# Patient Record
Sex: Male | Born: 1950 | Race: White | Hispanic: No | Smoking: Never smoker
Health system: Southern US, Community
[De-identification: ages and names within clinical notes are randomized; demographics above are authoritative.]

## PROBLEM LIST (undated history)

## (undated) DIAGNOSIS — C259 Malignant neoplasm of pancreas, unspecified: Secondary | ICD-10-CM

## (undated) HISTORY — PX: APPENDECTOMY: SHX54

---

## 2016-02-10 DIAGNOSIS — E785 Hyperlipidemia, unspecified: Secondary | ICD-10-CM | POA: Diagnosis not present

## 2016-02-10 DIAGNOSIS — Z9181 History of falling: Secondary | ICD-10-CM | POA: Diagnosis not present

## 2016-02-10 DIAGNOSIS — Z79899 Other long term (current) drug therapy: Secondary | ICD-10-CM | POA: Diagnosis not present

## 2016-02-10 DIAGNOSIS — M25511 Pain in right shoulder: Secondary | ICD-10-CM | POA: Diagnosis not present

## 2016-02-10 DIAGNOSIS — Z6828 Body mass index (BMI) 28.0-28.9, adult: Secondary | ICD-10-CM | POA: Diagnosis not present

## 2016-02-10 DIAGNOSIS — Z1389 Encounter for screening for other disorder: Secondary | ICD-10-CM | POA: Diagnosis not present

## 2016-02-10 DIAGNOSIS — R739 Hyperglycemia, unspecified: Secondary | ICD-10-CM | POA: Diagnosis not present

## 2016-02-10 DIAGNOSIS — I10 Essential (primary) hypertension: Secondary | ICD-10-CM | POA: Diagnosis not present

## 2016-08-11 DIAGNOSIS — K219 Gastro-esophageal reflux disease without esophagitis: Secondary | ICD-10-CM | POA: Diagnosis not present

## 2016-08-11 DIAGNOSIS — Z79899 Other long term (current) drug therapy: Secondary | ICD-10-CM | POA: Diagnosis not present

## 2016-08-11 DIAGNOSIS — M25511 Pain in right shoulder: Secondary | ICD-10-CM | POA: Diagnosis not present

## 2016-08-11 DIAGNOSIS — R0602 Shortness of breath: Secondary | ICD-10-CM | POA: Diagnosis not present

## 2016-08-11 DIAGNOSIS — I10 Essential (primary) hypertension: Secondary | ICD-10-CM | POA: Diagnosis not present

## 2016-08-11 DIAGNOSIS — Z6827 Body mass index (BMI) 27.0-27.9, adult: Secondary | ICD-10-CM | POA: Diagnosis not present

## 2016-08-11 DIAGNOSIS — R739 Hyperglycemia, unspecified: Secondary | ICD-10-CM | POA: Diagnosis not present

## 2016-08-11 DIAGNOSIS — E785 Hyperlipidemia, unspecified: Secondary | ICD-10-CM | POA: Diagnosis not present

## 2016-08-11 DIAGNOSIS — Z125 Encounter for screening for malignant neoplasm of prostate: Secondary | ICD-10-CM | POA: Diagnosis not present

## 2016-08-15 DIAGNOSIS — M25511 Pain in right shoulder: Secondary | ICD-10-CM | POA: Diagnosis not present

## 2016-08-15 DIAGNOSIS — M7541 Impingement syndrome of right shoulder: Secondary | ICD-10-CM | POA: Diagnosis not present

## 2016-10-27 DIAGNOSIS — M159 Polyosteoarthritis, unspecified: Secondary | ICD-10-CM | POA: Diagnosis not present

## 2016-10-27 DIAGNOSIS — R0781 Pleurodynia: Secondary | ICD-10-CM | POA: Diagnosis not present

## 2016-10-27 DIAGNOSIS — Z6827 Body mass index (BMI) 27.0-27.9, adult: Secondary | ICD-10-CM | POA: Diagnosis not present

## 2016-10-27 DIAGNOSIS — E119 Type 2 diabetes mellitus without complications: Secondary | ICD-10-CM | POA: Diagnosis not present

## 2016-10-27 DIAGNOSIS — I10 Essential (primary) hypertension: Secondary | ICD-10-CM | POA: Diagnosis not present

## 2016-10-27 DIAGNOSIS — A6000 Herpesviral infection of urogenital system, unspecified: Secondary | ICD-10-CM | POA: Diagnosis not present

## 2016-10-27 DIAGNOSIS — R809 Proteinuria, unspecified: Secondary | ICD-10-CM | POA: Diagnosis not present

## 2016-10-27 DIAGNOSIS — J309 Allergic rhinitis, unspecified: Secondary | ICD-10-CM | POA: Diagnosis not present

## 2016-10-27 DIAGNOSIS — K7689 Other specified diseases of liver: Secondary | ICD-10-CM | POA: Diagnosis not present

## 2016-10-27 DIAGNOSIS — K219 Gastro-esophageal reflux disease without esophagitis: Secondary | ICD-10-CM | POA: Diagnosis not present

## 2017-01-31 DIAGNOSIS — M25511 Pain in right shoulder: Secondary | ICD-10-CM | POA: Diagnosis not present

## 2017-02-01 DIAGNOSIS — M75121 Complete rotator cuff tear or rupture of right shoulder, not specified as traumatic: Secondary | ICD-10-CM | POA: Diagnosis not present

## 2017-02-06 DIAGNOSIS — M75121 Complete rotator cuff tear or rupture of right shoulder, not specified as traumatic: Secondary | ICD-10-CM | POA: Diagnosis not present

## 2017-02-10 DIAGNOSIS — Z6828 Body mass index (BMI) 28.0-28.9, adult: Secondary | ICD-10-CM | POA: Diagnosis not present

## 2017-02-10 DIAGNOSIS — Z79899 Other long term (current) drug therapy: Secondary | ICD-10-CM | POA: Diagnosis not present

## 2017-02-10 DIAGNOSIS — E785 Hyperlipidemia, unspecified: Secondary | ICD-10-CM | POA: Diagnosis not present

## 2017-02-10 DIAGNOSIS — R739 Hyperglycemia, unspecified: Secondary | ICD-10-CM | POA: Diagnosis not present

## 2017-02-10 DIAGNOSIS — M1990 Unspecified osteoarthritis, unspecified site: Secondary | ICD-10-CM | POA: Diagnosis not present

## 2017-02-10 DIAGNOSIS — I10 Essential (primary) hypertension: Secondary | ICD-10-CM | POA: Diagnosis not present

## 2017-02-10 DIAGNOSIS — K219 Gastro-esophageal reflux disease without esophagitis: Secondary | ICD-10-CM | POA: Diagnosis not present

## 2017-03-21 DIAGNOSIS — Z0000 Encounter for general adult medical examination without abnormal findings: Secondary | ICD-10-CM | POA: Diagnosis not present

## 2017-03-21 DIAGNOSIS — E785 Hyperlipidemia, unspecified: Secondary | ICD-10-CM | POA: Diagnosis not present

## 2017-03-21 DIAGNOSIS — Z9181 History of falling: Secondary | ICD-10-CM | POA: Diagnosis not present

## 2017-03-21 DIAGNOSIS — Z1331 Encounter for screening for depression: Secondary | ICD-10-CM | POA: Diagnosis not present

## 2017-03-21 DIAGNOSIS — Z125 Encounter for screening for malignant neoplasm of prostate: Secondary | ICD-10-CM | POA: Diagnosis not present

## 2017-03-23 DIAGNOSIS — R002 Palpitations: Secondary | ICD-10-CM | POA: Diagnosis not present

## 2017-03-23 DIAGNOSIS — Z1331 Encounter for screening for depression: Secondary | ICD-10-CM | POA: Diagnosis not present

## 2017-03-23 DIAGNOSIS — Z6827 Body mass index (BMI) 27.0-27.9, adult: Secondary | ICD-10-CM | POA: Diagnosis not present

## 2017-03-23 DIAGNOSIS — I493 Ventricular premature depolarization: Secondary | ICD-10-CM | POA: Diagnosis not present

## 2017-04-12 DIAGNOSIS — K529 Noninfective gastroenteritis and colitis, unspecified: Secondary | ICD-10-CM | POA: Diagnosis not present

## 2017-04-12 DIAGNOSIS — K219 Gastro-esophageal reflux disease without esophagitis: Secondary | ICD-10-CM | POA: Diagnosis not present

## 2017-04-12 DIAGNOSIS — Z6827 Body mass index (BMI) 27.0-27.9, adult: Secondary | ICD-10-CM | POA: Diagnosis not present

## 2017-05-01 DIAGNOSIS — R1013 Epigastric pain: Secondary | ICD-10-CM | POA: Diagnosis not present

## 2017-05-01 DIAGNOSIS — R0789 Other chest pain: Secondary | ICD-10-CM | POA: Diagnosis not present

## 2017-05-01 DIAGNOSIS — R634 Abnormal weight loss: Secondary | ICD-10-CM | POA: Diagnosis not present

## 2017-05-09 DIAGNOSIS — I10 Essential (primary) hypertension: Secondary | ICD-10-CM | POA: Diagnosis not present

## 2017-05-09 DIAGNOSIS — I493 Ventricular premature depolarization: Secondary | ICD-10-CM | POA: Diagnosis not present

## 2017-05-19 DIAGNOSIS — E785 Hyperlipidemia, unspecified: Secondary | ICD-10-CM | POA: Diagnosis not present

## 2017-05-19 DIAGNOSIS — I10 Essential (primary) hypertension: Secondary | ICD-10-CM | POA: Diagnosis not present

## 2017-05-19 DIAGNOSIS — R739 Hyperglycemia, unspecified: Secondary | ICD-10-CM | POA: Diagnosis not present

## 2017-05-19 DIAGNOSIS — Z6826 Body mass index (BMI) 26.0-26.9, adult: Secondary | ICD-10-CM | POA: Diagnosis not present

## 2017-05-19 DIAGNOSIS — K219 Gastro-esophageal reflux disease without esophagitis: Secondary | ICD-10-CM | POA: Diagnosis not present

## 2017-05-19 DIAGNOSIS — E663 Overweight: Secondary | ICD-10-CM | POA: Diagnosis not present

## 2017-05-19 DIAGNOSIS — Z79899 Other long term (current) drug therapy: Secondary | ICD-10-CM | POA: Diagnosis not present

## 2017-05-25 DIAGNOSIS — I493 Ventricular premature depolarization: Secondary | ICD-10-CM | POA: Diagnosis not present

## 2017-05-25 DIAGNOSIS — I499 Cardiac arrhythmia, unspecified: Secondary | ICD-10-CM | POA: Diagnosis not present

## 2017-06-01 DIAGNOSIS — Z6826 Body mass index (BMI) 26.0-26.9, adult: Secondary | ICD-10-CM | POA: Diagnosis not present

## 2017-06-01 DIAGNOSIS — R109 Unspecified abdominal pain: Secondary | ICD-10-CM | POA: Diagnosis not present

## 2017-06-01 DIAGNOSIS — K219 Gastro-esophageal reflux disease without esophagitis: Secondary | ICD-10-CM | POA: Diagnosis not present

## 2017-06-01 DIAGNOSIS — K5909 Other constipation: Secondary | ICD-10-CM | POA: Diagnosis not present

## 2017-06-06 DIAGNOSIS — K2210 Ulcer of esophagus without bleeding: Secondary | ICD-10-CM | POA: Diagnosis not present

## 2017-06-06 DIAGNOSIS — K2270 Barrett's esophagus without dysplasia: Secondary | ICD-10-CM | POA: Diagnosis not present

## 2017-06-06 DIAGNOSIS — K219 Gastro-esophageal reflux disease without esophagitis: Secondary | ICD-10-CM | POA: Diagnosis not present

## 2017-06-06 DIAGNOSIS — Z1211 Encounter for screening for malignant neoplasm of colon: Secondary | ICD-10-CM | POA: Diagnosis not present

## 2017-06-06 DIAGNOSIS — Z79899 Other long term (current) drug therapy: Secondary | ICD-10-CM | POA: Diagnosis not present

## 2017-06-06 DIAGNOSIS — R634 Abnormal weight loss: Secondary | ICD-10-CM | POA: Diagnosis not present

## 2017-06-06 DIAGNOSIS — K208 Other esophagitis: Secondary | ICD-10-CM | POA: Diagnosis not present

## 2017-06-06 DIAGNOSIS — K449 Diaphragmatic hernia without obstruction or gangrene: Secondary | ICD-10-CM | POA: Diagnosis not present

## 2017-06-06 DIAGNOSIS — R1010 Upper abdominal pain, unspecified: Secondary | ICD-10-CM | POA: Diagnosis not present

## 2017-06-06 DIAGNOSIS — K210 Gastro-esophageal reflux disease with esophagitis: Secondary | ICD-10-CM | POA: Diagnosis not present

## 2017-06-06 DIAGNOSIS — Z7982 Long term (current) use of aspirin: Secondary | ICD-10-CM | POA: Diagnosis not present

## 2017-06-30 DIAGNOSIS — R1011 Right upper quadrant pain: Secondary | ICD-10-CM | POA: Diagnosis not present

## 2017-07-06 DIAGNOSIS — K5900 Constipation, unspecified: Secondary | ICD-10-CM | POA: Diagnosis not present

## 2017-07-06 DIAGNOSIS — K219 Gastro-esophageal reflux disease without esophagitis: Secondary | ICD-10-CM | POA: Diagnosis not present

## 2017-07-06 DIAGNOSIS — K2270 Barrett's esophagus without dysplasia: Secondary | ICD-10-CM | POA: Diagnosis not present

## 2017-07-20 DIAGNOSIS — R1011 Right upper quadrant pain: Secondary | ICD-10-CM | POA: Diagnosis not present

## 2017-07-20 DIAGNOSIS — R109 Unspecified abdominal pain: Secondary | ICD-10-CM | POA: Diagnosis not present

## 2017-07-25 DIAGNOSIS — R1013 Epigastric pain: Secondary | ICD-10-CM | POA: Diagnosis not present

## 2017-07-25 DIAGNOSIS — R634 Abnormal weight loss: Secondary | ICD-10-CM | POA: Diagnosis not present

## 2017-07-25 DIAGNOSIS — R1033 Periumbilical pain: Secondary | ICD-10-CM | POA: Diagnosis not present

## 2017-07-26 DIAGNOSIS — R1033 Periumbilical pain: Secondary | ICD-10-CM | POA: Diagnosis not present

## 2017-07-26 DIAGNOSIS — R634 Abnormal weight loss: Secondary | ICD-10-CM | POA: Diagnosis not present

## 2017-07-26 DIAGNOSIS — R1013 Epigastric pain: Secondary | ICD-10-CM | POA: Diagnosis not present

## 2017-07-26 DIAGNOSIS — I82890 Acute embolism and thrombosis of other specified veins: Secondary | ICD-10-CM | POA: Diagnosis not present

## 2017-07-26 DIAGNOSIS — C259 Malignant neoplasm of pancreas, unspecified: Secondary | ICD-10-CM | POA: Diagnosis not present

## 2017-08-01 DIAGNOSIS — K8689 Other specified diseases of pancreas: Secondary | ICD-10-CM | POA: Diagnosis not present

## 2017-08-02 ENCOUNTER — Other Ambulatory Visit: Payer: Self-pay

## 2017-08-02 ENCOUNTER — Telehealth: Payer: Self-pay | Admitting: Gastroenterology

## 2017-08-02 DIAGNOSIS — K8689 Other specified diseases of pancreas: Secondary | ICD-10-CM

## 2017-08-02 DIAGNOSIS — R935 Abnormal findings on diagnostic imaging of other abdominal regions, including retroperitoneum: Secondary | ICD-10-CM | POA: Diagnosis not present

## 2017-08-02 DIAGNOSIS — C259 Malignant neoplasm of pancreas, unspecified: Secondary | ICD-10-CM | POA: Diagnosis not present

## 2017-08-02 DIAGNOSIS — I700 Atherosclerosis of aorta: Secondary | ICD-10-CM | POA: Diagnosis not present

## 2017-08-02 NOTE — Telephone Encounter (Signed)
[facility] 230 pm arrive at 1230 pm for EUS the pt has been advised and instructed.

## 2017-08-02 NOTE — Telephone Encounter (Signed)
I reviewed a packet of information from the [facility].  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  No obvious liver metastases.  His liver tests are normal.     Patty, He needs endoscopic ultrasound with fine-needle aspiration.  I would like to do this for him tomorrow if [facility] endoscopy can support it.  If not then next Thursday if [place] endoscopy can support it.  MAC sedation, diagnosis pancreatic mass.  He is not on any blood thinners.

## 2017-08-02 NOTE — Telephone Encounter (Signed)
Please review

## 2017-08-03 ENCOUNTER — Ambulatory Visit (HOSPITAL_COMMUNITY): Payer: Medicare Other | Admitting: Anesthesiology

## 2017-08-03 ENCOUNTER — Encounter (HOSPITAL_COMMUNITY): Payer: Self-pay | Admitting: Registered Nurse

## 2017-08-03 ENCOUNTER — Telehealth: Payer: Self-pay | Admitting: Gastroenterology

## 2017-08-03 ENCOUNTER — Encounter (HOSPITAL_COMMUNITY)
Admission: RE | Disposition: A | Discharge status: Home / Self Care | Payer: Self-pay | Source: Ambulatory Visit | Attending: Gastroenterology

## 2017-08-03 ENCOUNTER — Ambulatory Visit (HOSPITAL_COMMUNITY)
Admission: RE | Admit: 2017-08-03 | Discharge: 2017-08-03 | Disposition: A | Discharge status: Home / Self Care | Payer: Medicare Other | Source: Ambulatory Visit | Attending: Gastroenterology | Admitting: Gastroenterology

## 2017-08-03 ENCOUNTER — Other Ambulatory Visit: Payer: Self-pay

## 2017-08-03 DIAGNOSIS — Z79899 Other long term (current) drug therapy: Secondary | ICD-10-CM | POA: Insufficient documentation

## 2017-08-03 DIAGNOSIS — R933 Abnormal findings on diagnostic imaging of other parts of digestive tract: Secondary | ICD-10-CM | POA: Diagnosis not present

## 2017-08-03 DIAGNOSIS — I10 Essential (primary) hypertension: Secondary | ICD-10-CM | POA: Diagnosis not present

## 2017-08-03 DIAGNOSIS — K869 Disease of pancreas, unspecified: Secondary | ICD-10-CM | POA: Diagnosis present

## 2017-08-03 DIAGNOSIS — K219 Gastro-esophageal reflux disease without esophagitis: Secondary | ICD-10-CM | POA: Insufficient documentation

## 2017-08-03 DIAGNOSIS — K8689 Other specified diseases of pancreas: Secondary | ICD-10-CM | POA: Diagnosis not present

## 2017-08-03 DIAGNOSIS — C251 Malignant neoplasm of body of pancreas: Secondary | ICD-10-CM

## 2017-08-03 HISTORY — PX: EUS: SHX5427

## 2017-08-03 HISTORY — PX: FINE NEEDLE ASPIRATION: SHX5430

## 2017-08-03 SURGERY — UPPER ENDOSCOPIC ULTRASOUND (EUS) RADIAL
Anesthesia: Monitor Anesthesia Care

## 2017-08-03 MED ORDER — PROPOFOL 10 MG/ML IV BOLUS
INTRAVENOUS | Status: AC
  Filled 2017-08-03: qty 20

## 2017-08-03 MED ORDER — SODIUM CHLORIDE 0.9 % IV SOLN: INTRAVENOUS | Status: DC

## 2017-08-03 MED ORDER — LACTATED RINGERS IV SOLN
INTRAVENOUS | Status: DC
  Administered 2017-08-03: 13:00:00 via INTRAVENOUS

## 2017-08-03 MED ORDER — PROPOFOL 10 MG/ML IV BOLUS
INTRAVENOUS | Status: DC | PRN
  Administered 2017-08-03 (×5): 20 mg via INTRAVENOUS
  Administered 2017-08-03: 30 mg via INTRAVENOUS
  Administered 2017-08-03 (×13): 20 mg via INTRAVENOUS

## 2017-08-03 MED ORDER — ONDANSETRON HCL 4 MG/2ML IJ SOLN
INTRAMUSCULAR | Status: DC | PRN
  Administered 2017-08-03: 4 mg via INTRAVENOUS

## 2017-08-03 MED ORDER — LIDOCAINE 2% (20 MG/ML) 5 ML SYRINGE
INTRAMUSCULAR | Status: DC | PRN
  Administered 2017-08-03: 40 mg via INTRAVENOUS

## 2017-08-03 NOTE — Op Note (Addendum)
[facility] Patient Name: Tommy Oconnor Procedure Date: 08/03/2017 MRN: [id number] Attending MD: Milus Banister , MD Date of Birth: 1950/11/10 CSN: [id number] Age: 67 Admit Type: Outpatient Procedure:                Upper EUS Indications:              abdominal pain and weight loss led to several tests                            including a CT scan in [place]; report states a                            5.1cm by 3.7cm mass in the body of pancreas with                            upstream pancreatic duct dilation. The mass                            involves the SMA, celiac trunk, SMV, splenic vein.                            No clear metastatic disease Providers:                Milus Banister, MD, Cleda Daub, RN, Charolette Child, Technician, Marla Roe, CRNA Referring MD:             Adonis Huguenin, RN at [facility] in                            [place]:                Propofol per Anesthesia Complications:            No immediate complications. Estimated blood loss:                            None. Estimated Blood Loss:     Estimated blood loss: none. Procedure:                Pre-Anesthesia Assessment:                           - Prior to the procedure, a History and Physical                            was performed, and patient medications and                            allergies were reviewed. The patient's tolerance of                            previous anesthesia was also reviewed. The risks  and benefits of the procedure and the sedation                            options and risks were discussed with the patient.                            All questions were answered, and informed consent                            was obtained. Prior Anticoagulants: The patient has                            taken no previous anticoagulant or antiplatelet                            agents. ASA  Grade Assessment: II - A patient with                            mild systemic disease. After reviewing the risks                            and benefits, the patient was deemed in                            satisfactory condition to undergo the procedure.                           After obtaining informed consent, the endoscope was                            passed under direct vision. Throughout the                            procedure, the patient's blood pressure, pulse, and                            oxygen saturations were monitored continuously. The                            [id number] ([id number]) scope was introduced through                            the mouth, and advanced to the second part of                            duodenum. The upper EUS was accomplished without                            difficulty. The patient tolerated the procedure                            well. Scope In: Scope Out: Findings:      ENDOSCOPIC FINDING: :      The examined esophagus  was endoscopically normal.      The entire examined stomach was endoscopically normal.      The examined duodenum was endoscopically normal.      ENDOSONOGRAPHIC FINDING: :      1. An irregular mass was identified in the pancreatic body. The mass was       hypoechoic and heterogenous. The mass measured 47 mm in maximal       cross-sectional diameter. The endosonographic borders were       poorly-defined. The endosonographic appearance of parenchyma and the       upstream pancreatic duct indicated duct dilation. Fine needle aspiration       for cytology was performed. Color Doppler imaging was utilized prior to       needle puncture to confirm a lack of significant vascular structures       within the needle path. Three passes were made with the 25 gauge needle       using a transgastric approach.      2. No peripancreatic adenopathy. Impression:               - Large mass in the body of pancreas (5.1cm per                             outside CT report) causing main pancreatic duct                            obstruction, upstream dilation. 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 The outside CT report shows the                            mass is unresectable based on significant major                            vessel involvement (SMV, SMA, celiac trunk).                           - Await final cytology results. Moderate Sedation:      N/A- Per Anesthesia Care Recommendation:           - Discharge patient to home (ambulatory). Procedure Code(s):        --- Professional ---                           [phone], Esophagogastroduodenoscopy, flexible,                            transoral; with transendoscopic ultrasound-guided                            intramural or  transmural fine needle                            aspiration/biopsy(s), (includes endoscopic                            ultrasound examination limited to the esophagus,                            stomach or duodenum, and adjacent structures) Diagnosis Code(s):        --- Professional ---                           R93.3, Abnormal findings on diagnostic imaging of                            other parts of digestive tract CPT copyright 2017 American Medical Association. All rights reserved. The codes documented in this report are preliminary and upon coder review may  be revised to meet current compliance requirements. Milus Banister, MD 08/03/2017 2:42:16 PM This report has been signed electronically. Number of Addenda: 0

## 2017-08-03 NOTE — Telephone Encounter (Signed)
Patient notified that [facility] to take his meds this am

## 2017-08-03 NOTE — Discharge Instructions (Signed)
YOU HAD AN ENDOSCOPIC PROCEDURE TODAY: Refer to the procedure report and other information in the discharge instructions given to you for any specific questions about what was found during the examination. If this information does not answer your questions, please call [place] office at [phone] to clarify.  ° °YOU SHOULD EXPECT: Some feelings of bloating in the abdomen. Passage of more gas than usual. Walking can help get rid of the air that was put into your GI tract during the procedure and reduce the bloating. If you had a lower endoscopy (such as a colonoscopy or flexible sigmoidoscopy) you may notice spotting of blood in your stool or on the toilet paper. Some abdominal soreness may be present for a day or two, also. ° °DIET: Your first meal following the procedure should be a light meal and then it is ok to progress to your normal diet. A half-sandwich or bowl of soup is an example of a good first meal. Heavy or fried foods are harder to digest and may make you feel nauseous or bloated. Drink plenty of fluids but you should avoid alcoholic beverages for 24 hours. If you had a esophageal dilation, please see attached instructions for diet.   ° °ACTIVITY: Your care partner should take you home directly after the procedure. You should plan to take it easy, moving slowly for the rest of the day. You can resume normal activity the day after the procedure however YOU SHOULD NOT DRIVE, use power tools, machinery or perform tasks that involve climbing or major physical exertion for 24 hours (because of the sedation medicines used during the test).  ° °SYMPTOMS TO REPORT IMMEDIATELY: °A gastroenterologist can be reached at any hour. Please call [phone]  for any of the following symptoms:  °Following lower endoscopy (colonoscopy, flexible sigmoidoscopy) °Excessive amounts of blood in the stool  °Significant tenderness, worsening of abdominal pains  °Swelling of the abdomen that is new, acute  °Fever of 100° or  higher  °Following upper endoscopy (EGD, EUS, ERCP, esophageal dilation) °Vomiting of blood or coffee ground material  °New, significant abdominal pain  °New, significant chest pain or pain under the shoulder blades  °Painful or persistently difficult swallowing  °New shortness of breath  °Black, tarry-looking or red, bloody stools ° °FOLLOW UP:  °If any biopsies were taken you will be contacted by phone or by letter within the next 1-3 weeks. Call [phone]  if you have not heard about the biopsies in 3 weeks.  °Please also call with any specific questions about appointments or follow up tests. ° °

## 2017-08-03 NOTE — Anesthesia Preprocedure Evaluation (Signed)
Anesthesia Evaluation  Patient identified by MRN, date of birth, ID band Patient awake    Reviewed: Allergy & Precautions, NPO status , Patient's Chart, lab work & pertinent test results  Airway Mallampati: II  TM Distance: >3 FB Neck ROM: Full    Dental   Pulmonary neg pulmonary ROS,    breath sounds clear to auscultation       Cardiovascular hypertension,  Rhythm:Regular Rate:Normal     Neuro/Psych negative neurological ROS     GI/Hepatic Neg liver ROS, GERD  ,Pancreatic mass   Endo/Other  negative endocrine ROS  Renal/GU negative Renal ROS     Musculoskeletal   Abdominal   Peds  Hematology negative hematology ROS (+)   Anesthesia Other Findings   Reproductive/Obstetrics                             Anesthesia Physical Anesthesia Plan  ASA: III  Anesthesia Plan: MAC   Post-op Pain Management:    Induction: Intravenous  PONV Risk Score and Plan: 1 and Propofol infusion, Ondansetron and Treatment may vary due to age or medical condition  Airway Management Planned: Natural Airway and Nasal Cannula  Additional Equipment:   Intra-op Plan:   Post-operative Plan:   Informed Consent: I have reviewed the patients History and Physical, chart, labs and discussed the procedure including the risks, benefits and alternatives for the proposed anesthesia with the patient or authorized representative who has indicated his/her understanding and acceptance.     Plan Discussed with:   Anesthesia Plan Comments:         Anesthesia Quick Evaluation

## 2017-08-03 NOTE — Transfer of Care (Signed)
Immediate Anesthesia Transfer of Care Note  Patient: Tommy Oconnor  Procedure(s) Performed: UPPER ENDOSCOPIC ULTRASOUND (EUS) RADIAL (N/A ) FINE NEEDLE ASPIRATION (FNA) LINEAR (N/A )  Patient Location: PACU  Anesthesia Type:MAC  Level of Consciousness: sedated  Airway & Oxygen Therapy: Patient Spontanous Breathing and Patient connected to nasal cannula oxygen  Post-op Assessment: Report given to RN and Post -op Vital signs reviewed and stable  Post vital signs: Reviewed and stable  Last Vitals:  Vitals Value Taken Time  BP    Temp    Pulse    Resp    SpO2      Last Pain:  Vitals:   08/03/17 1232  TempSrc: Oral  PainSc: 3          Complications: No apparent anesthesia complications

## 2017-08-03 NOTE — H&P (Signed)
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 Lyda Jester in [facility] for weight loss and upper abdominal pain over the past month and a half.  Upper endoscopy suggested reflux related esophagitis, hiatal hernia.  Colonoscopy was normal.  Ultrasound of the abdomen was normal.  Eventual CT scan abdomen and pelvis on Jul 26, 2017 showed a 5.1 cm x 3.7 cm hypoenhancing mass in the body of the pancreas.  There was atrophy of the pancreatic tail and upstream ductal dilation.  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  Occlusion of the SMV.  Multiple collateral portal veins.  There was circumferential soft tissue around the celiac artery and SMA..  ROS: complete GI ROS as described in HPI, all other review negative.  Constitutional:     History reviewed. No pertinent past medical history.  History reviewed. No pertinent surgical history.  Current Facility-Administered Medications  Medication Dose Route Frequency Provider Last Rate Last Dose  . 0.9 %  sodium chloride infusion   Intravenous Continuous Milus Banister, MD      . lactated ringers infusion   Intravenous Continuous Milus Banister, MD 10 mL/hr at 08/03/17 1248      Allergies as of 08/02/2017  . (Not on File)    History reviewed. No pertinent family history.  Social History   Socioeconomic History  . Marital status: Single    Spouse name: Not on file  . Number of children: Not on file  . Years of education: Not on file  . Highest education level: Not on file  Occupational History  . Not on file  Social Needs  . Financial resource strain: Not on file  . Food insecurity:    Worry: Not on file    Inability: Not on file  . Transportation needs:    Medical: Not on file    Non-medical: Not on file  Tobacco Use  . Smoking status: Not on file  Substance and Sexual Activity  . Alcohol use: Not on file  . Drug use: Not on file  . Sexual  activity: Not on file  Lifestyle  . Physical activity:    Days per week: Not on file    Minutes per session: Not on file  . Stress: Not on file  Relationships  . Social connections:    Talks on phone: Not on file    Gets together: Not on file    Attends religious service: Not on file    Active member of club or organization: Not on file    Attends meetings of clubs or organizations: Not on file    Relationship status: Not on file  . Intimate partner violence:    Fear of current or ex partner: Not on file    Emotionally abused: Not on file    Physically abused: Not on file    Forced sexual activity: Not on file  Other Topics Concern  . Not on file  Social History Narrative  . 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 For upper EUS today.  Please see the "Patient Instructions" section for addition details about the plan.  Owens Loffler, MD [facility] 08/03/2017, 1:54 PM

## 2017-08-04 ENCOUNTER — Telehealth: Payer: Self-pay | Admitting: Gastroenterology

## 2017-08-04 NOTE — Telephone Encounter (Signed)
-----   Message from Milus Banister, MD sent at 08/04/2017  1:12 PM EDT ----- I left a voicemail letting him know that the final pathology was the same as the preliminary report that he and I discussed yesterday after his procedure.  Can you please send a copy of this biopsy report as well as yesterday's endoscopic ultrasound report to the [facility] in [place] thank you

## 2017-08-04 NOTE — Anesthesia Postprocedure Evaluation (Signed)
Anesthesia Post Note  Patient: Jolon Degante  Procedure(s) Performed: UPPER ENDOSCOPIC ULTRASOUND (EUS) RADIAL (N/A ) FINE NEEDLE ASPIRATION (FNA) LINEAR (N/A )     Patient location during evaluation: PACU Anesthesia Type: MAC Level of consciousness: awake and alert Pain management: pain level controlled Vital Signs Assessment: post-procedure vital signs reviewed and stable Respiratory status: spontaneous breathing, nonlabored ventilation, respiratory function stable and patient connected to nasal cannula oxygen Cardiovascular status: stable and blood pressure returned to baseline Postop Assessment: no apparent nausea or vomiting Anesthetic complications: no    Last Vitals:  Vitals:   08/03/17 1500 08/03/17 1510  BP: 106/75 112/76  Pulse: 87 92  Resp: 17 19  Temp:    SpO2: 100% 100%    Last Pain:  Vitals:   08/03/17 1510  TempSrc:   PainSc: 0-No pain                 Tiajuana Amass

## 2017-08-04 NOTE — Telephone Encounter (Signed)
All copies of biopsy report and EUS report was sent to [facility] in [place].

## 2017-08-07 ENCOUNTER — Encounter (HOSPITAL_COMMUNITY): Payer: Self-pay | Admitting: Gastroenterology

## 2017-08-10 DIAGNOSIS — R978 Other abnormal tumor markers: Secondary | ICD-10-CM | POA: Diagnosis not present

## 2017-08-10 DIAGNOSIS — C251 Malignant neoplasm of body of pancreas: Secondary | ICD-10-CM | POA: Diagnosis not present

## 2017-08-11 DIAGNOSIS — C259 Malignant neoplasm of pancreas, unspecified: Secondary | ICD-10-CM | POA: Diagnosis not present

## 2017-08-14 DIAGNOSIS — Z452 Encounter for adjustment and management of vascular access device: Secondary | ICD-10-CM | POA: Diagnosis not present

## 2017-08-14 DIAGNOSIS — M159 Polyosteoarthritis, unspecified: Secondary | ICD-10-CM | POA: Diagnosis not present

## 2017-08-14 DIAGNOSIS — Z79899 Other long term (current) drug therapy: Secondary | ICD-10-CM | POA: Diagnosis not present

## 2017-08-14 DIAGNOSIS — Z7982 Long term (current) use of aspirin: Secondary | ICD-10-CM | POA: Diagnosis not present

## 2017-08-14 DIAGNOSIS — K219 Gastro-esophageal reflux disease without esophagitis: Secondary | ICD-10-CM | POA: Diagnosis not present

## 2017-08-14 DIAGNOSIS — I10 Essential (primary) hypertension: Secondary | ICD-10-CM | POA: Diagnosis not present

## 2017-08-14 DIAGNOSIS — C259 Malignant neoplasm of pancreas, unspecified: Secondary | ICD-10-CM | POA: Diagnosis not present

## 2017-08-15 DIAGNOSIS — C251 Malignant neoplasm of body of pancreas: Secondary | ICD-10-CM | POA: Diagnosis not present

## 2017-08-16 DIAGNOSIS — C251 Malignant neoplasm of body of pancreas: Secondary | ICD-10-CM | POA: Diagnosis not present

## 2017-08-17 DIAGNOSIS — Z452 Encounter for adjustment and management of vascular access device: Secondary | ICD-10-CM | POA: Diagnosis not present

## 2017-08-17 DIAGNOSIS — C251 Malignant neoplasm of body of pancreas: Secondary | ICD-10-CM | POA: Diagnosis not present

## 2017-08-24 DIAGNOSIS — C251 Malignant neoplasm of body of pancreas: Secondary | ICD-10-CM | POA: Diagnosis not present

## 2017-08-29 DIAGNOSIS — C251 Malignant neoplasm of body of pancreas: Secondary | ICD-10-CM | POA: Diagnosis not present

## 2017-08-29 DIAGNOSIS — D709 Neutropenia, unspecified: Secondary | ICD-10-CM | POA: Diagnosis not present

## 2017-08-30 DIAGNOSIS — C251 Malignant neoplasm of body of pancreas: Secondary | ICD-10-CM | POA: Diagnosis not present

## 2017-08-30 DIAGNOSIS — Z5111 Encounter for antineoplastic chemotherapy: Secondary | ICD-10-CM | POA: Diagnosis not present

## 2017-09-01 DIAGNOSIS — Z452 Encounter for adjustment and management of vascular access device: Secondary | ICD-10-CM | POA: Diagnosis not present

## 2017-09-01 DIAGNOSIS — C251 Malignant neoplasm of body of pancreas: Secondary | ICD-10-CM | POA: Diagnosis not present

## 2017-09-06 DIAGNOSIS — Z79899 Other long term (current) drug therapy: Secondary | ICD-10-CM | POA: Diagnosis not present

## 2017-09-06 DIAGNOSIS — K869 Disease of pancreas, unspecified: Secondary | ICD-10-CM | POA: Diagnosis not present

## 2017-09-06 DIAGNOSIS — K529 Noninfective gastroenteritis and colitis, unspecified: Secondary | ICD-10-CM | POA: Diagnosis not present

## 2017-09-06 DIAGNOSIS — Z0001 Encounter for general adult medical examination with abnormal findings: Secondary | ICD-10-CM | POA: Diagnosis not present

## 2017-09-06 DIAGNOSIS — R000 Tachycardia, unspecified: Secondary | ICD-10-CM | POA: Diagnosis present

## 2017-09-06 DIAGNOSIS — C251 Malignant neoplasm of body of pancreas: Secondary | ICD-10-CM | POA: Diagnosis present

## 2017-09-06 DIAGNOSIS — M1990 Unspecified osteoarthritis, unspecified site: Secondary | ICD-10-CM | POA: Diagnosis present

## 2017-09-06 DIAGNOSIS — E876 Hypokalemia: Secondary | ICD-10-CM | POA: Diagnosis present

## 2017-09-06 DIAGNOSIS — D61818 Other pancytopenia: Secondary | ICD-10-CM | POA: Diagnosis not present

## 2017-09-06 DIAGNOSIS — E860 Dehydration: Secondary | ICD-10-CM | POA: Diagnosis present

## 2017-09-06 DIAGNOSIS — D709 Neutropenia, unspecified: Secondary | ICD-10-CM | POA: Diagnosis not present

## 2017-09-06 DIAGNOSIS — R197 Diarrhea, unspecified: Secondary | ICD-10-CM | POA: Diagnosis not present

## 2017-09-06 DIAGNOSIS — I10 Essential (primary) hypertension: Secondary | ICD-10-CM | POA: Diagnosis present

## 2017-09-06 DIAGNOSIS — E871 Hypo-osmolality and hyponatremia: Secondary | ICD-10-CM | POA: Diagnosis present

## 2017-09-06 DIAGNOSIS — A09 Infectious gastroenteritis and colitis, unspecified: Secondary | ICD-10-CM | POA: Diagnosis present

## 2017-09-06 DIAGNOSIS — D61810 Antineoplastic chemotherapy induced pancytopenia: Secondary | ICD-10-CM | POA: Diagnosis present

## 2017-09-06 DIAGNOSIS — R5081 Fever presenting with conditions classified elsewhere: Secondary | ICD-10-CM | POA: Diagnosis not present

## 2017-09-07 DIAGNOSIS — D61818 Other pancytopenia: Secondary | ICD-10-CM

## 2017-09-07 DIAGNOSIS — Z0001 Encounter for general adult medical examination with abnormal findings: Secondary | ICD-10-CM | POA: Diagnosis not present

## 2017-09-07 DIAGNOSIS — D709 Neutropenia, unspecified: Secondary | ICD-10-CM

## 2017-09-07 DIAGNOSIS — E871 Hypo-osmolality and hyponatremia: Secondary | ICD-10-CM

## 2017-09-07 DIAGNOSIS — C251 Malignant neoplasm of body of pancreas: Secondary | ICD-10-CM

## 2017-09-07 DIAGNOSIS — K529 Noninfective gastroenteritis and colitis, unspecified: Secondary | ICD-10-CM

## 2017-09-08 DIAGNOSIS — D709 Neutropenia, unspecified: Secondary | ICD-10-CM

## 2017-09-08 DIAGNOSIS — C251 Malignant neoplasm of body of pancreas: Secondary | ICD-10-CM

## 2017-09-08 DIAGNOSIS — K529 Noninfective gastroenteritis and colitis, unspecified: Secondary | ICD-10-CM

## 2017-09-09 DIAGNOSIS — D61818 Other pancytopenia: Secondary | ICD-10-CM

## 2017-09-09 DIAGNOSIS — C251 Malignant neoplasm of body of pancreas: Secondary | ICD-10-CM

## 2017-09-10 DIAGNOSIS — R197 Diarrhea, unspecified: Secondary | ICD-10-CM

## 2017-09-10 DIAGNOSIS — C251 Malignant neoplasm of body of pancreas: Secondary | ICD-10-CM

## 2017-09-10 DIAGNOSIS — R5081 Fever presenting with conditions classified elsewhere: Secondary | ICD-10-CM

## 2017-09-10 DIAGNOSIS — E860 Dehydration: Secondary | ICD-10-CM

## 2017-09-10 DIAGNOSIS — D709 Neutropenia, unspecified: Secondary | ICD-10-CM

## 2017-09-13 DIAGNOSIS — C251 Malignant neoplasm of body of pancreas: Secondary | ICD-10-CM

## 2017-09-20 DIAGNOSIS — Z86718 Personal history of other venous thrombosis and embolism: Secondary | ICD-10-CM | POA: Diagnosis not present

## 2017-09-20 DIAGNOSIS — R600 Localized edema: Secondary | ICD-10-CM | POA: Diagnosis not present

## 2017-09-20 DIAGNOSIS — C251 Malignant neoplasm of body of pancreas: Secondary | ICD-10-CM | POA: Diagnosis not present

## 2017-09-21 DIAGNOSIS — C251 Malignant neoplasm of body of pancreas: Secondary | ICD-10-CM | POA: Diagnosis not present

## 2017-09-21 DIAGNOSIS — R600 Localized edema: Secondary | ICD-10-CM | POA: Diagnosis not present

## 2017-09-21 DIAGNOSIS — M7989 Other specified soft tissue disorders: Secondary | ICD-10-CM | POA: Diagnosis not present

## 2017-09-21 DIAGNOSIS — Z86718 Personal history of other venous thrombosis and embolism: Secondary | ICD-10-CM | POA: Diagnosis not present

## 2017-09-22 DIAGNOSIS — C251 Malignant neoplasm of body of pancreas: Secondary | ICD-10-CM | POA: Diagnosis not present

## 2017-09-22 DIAGNOSIS — Z5189 Encounter for other specified aftercare: Secondary | ICD-10-CM | POA: Diagnosis not present

## 2017-09-27 DIAGNOSIS — R600 Localized edema: Secondary | ICD-10-CM | POA: Diagnosis not present

## 2017-09-27 DIAGNOSIS — R509 Fever, unspecified: Secondary | ICD-10-CM | POA: Diagnosis not present

## 2017-09-27 DIAGNOSIS — R1084 Generalized abdominal pain: Secondary | ICD-10-CM | POA: Diagnosis not present

## 2017-09-27 DIAGNOSIS — R109 Unspecified abdominal pain: Secondary | ICD-10-CM | POA: Diagnosis not present

## 2017-09-27 DIAGNOSIS — D649 Anemia, unspecified: Secondary | ICD-10-CM | POA: Diagnosis not present

## 2017-09-27 DIAGNOSIS — Z0001 Encounter for general adult medical examination with abnormal findings: Secondary | ICD-10-CM | POA: Diagnosis not present

## 2017-09-27 DIAGNOSIS — C251 Malignant neoplasm of body of pancreas: Secondary | ICD-10-CM | POA: Diagnosis not present

## 2017-10-04 DIAGNOSIS — C251 Malignant neoplasm of body of pancreas: Secondary | ICD-10-CM | POA: Diagnosis not present

## 2017-10-04 DIAGNOSIS — D708 Other neutropenia: Secondary | ICD-10-CM | POA: Diagnosis not present

## 2017-10-04 DIAGNOSIS — Z79899 Other long term (current) drug therapy: Secondary | ICD-10-CM | POA: Diagnosis not present

## 2017-10-04 DIAGNOSIS — E860 Dehydration: Secondary | ICD-10-CM | POA: Diagnosis not present

## 2017-10-11 ENCOUNTER — Telehealth: Payer: Self-pay | Admitting: Gastroenterology

## 2017-10-11 DIAGNOSIS — C251 Malignant neoplasm of body of pancreas: Secondary | ICD-10-CM | POA: Diagnosis not present

## 2017-10-11 DIAGNOSIS — R945 Abnormal results of liver function studies: Secondary | ICD-10-CM | POA: Diagnosis not present

## 2017-10-11 DIAGNOSIS — R748 Abnormal levels of other serum enzymes: Secondary | ICD-10-CM | POA: Diagnosis not present

## 2017-10-11 DIAGNOSIS — C259 Malignant neoplasm of pancreas, unspecified: Secondary | ICD-10-CM | POA: Diagnosis not present

## 2017-10-11 NOTE — Telephone Encounter (Signed)
-----   Message from Dow Adolph sent at 10/11/2017  3:07 PM EDT ----- Dr. Bobby Rumpf @ [facility] in [place] Would like to discuss this mutual patient  [phone]

## 2017-10-11 NOTE — Telephone Encounter (Signed)
I spoke with Dr. Bobby Rumpf.  Mr. Mccaskill LFTs have significantly risen in past 2 weeks and CT scan shows the pancreatic body mass is larger and now he has new biliary dilation.  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    Patty, I am out of town next week and no availability in my MAC schedule tomorrow and so I spoke with Dr. Rush Landmark who has agreed to help with ERCP for Mr. Huffaker next week while shadowing in hospital.  Can you find a time for ERCP with stent placement on Wednesday or Thursday next week, either hospital is OK.  Mr. Littler knows you will be calling.  Can you also please get recent H and P, lab test results, CT report from the [facility] and get those paper records to Dr. Rush Landmark?   Their number is [phone].  Thanks  Chester Holstein, this is the patient we spoke about.  Thanks for your help with him.

## 2017-10-12 ENCOUNTER — Other Ambulatory Visit: Payer: Self-pay

## 2017-10-12 DIAGNOSIS — K8689 Other specified diseases of pancreas: Secondary | ICD-10-CM

## 2017-10-12 NOTE — Telephone Encounter (Signed)
Tommy Oconnor, once records arrive, please let me know.  If the patient's CT scan can mailed to [place] that would be nice.  If not, him bringing it on the day of procedure would be helpful as well.  Thank you.

## 2017-10-12 NOTE — Telephone Encounter (Signed)
I called the pt and he will bring the CT scan disc with him to the EUS

## 2017-10-12 NOTE — Telephone Encounter (Signed)
ERCP scheduled for 10/18/17 830 am with Dr Rush Landmark at [facility]  ERCP scheduled, pt instructed and medications reviewed.  Patient instructions mailed to home.  Patient to call with any questions or concerns. Records to be faxed to our office for review.

## 2017-10-13 ENCOUNTER — Inpatient Hospital Stay (HOSPITAL_COMMUNITY)
Admission: AD | Admit: 2017-10-13 | Discharge: 2017-10-17 | DRG: 444 | Disposition: A | Discharge status: Home / Self Care | Payer: Medicare Other | Source: Other Acute Inpatient Hospital | Attending: Internal Medicine | Admitting: Internal Medicine

## 2017-10-13 ENCOUNTER — Encounter (HOSPITAL_COMMUNITY): Payer: Self-pay | Admitting: Internal Medicine

## 2017-10-13 ENCOUNTER — Telehealth: Payer: Self-pay

## 2017-10-13 ENCOUNTER — Other Ambulatory Visit: Payer: Self-pay

## 2017-10-13 DIAGNOSIS — R945 Abnormal results of liver function studies: Secondary | ICD-10-CM | POA: Diagnosis not present

## 2017-10-13 DIAGNOSIS — Z794 Long term (current) use of insulin: Secondary | ICD-10-CM

## 2017-10-13 DIAGNOSIS — R935 Abnormal findings on diagnostic imaging of other abdominal regions, including retroperitoneum: Secondary | ICD-10-CM | POA: Diagnosis not present

## 2017-10-13 DIAGNOSIS — K869 Disease of pancreas, unspecified: Secondary | ICD-10-CM | POA: Diagnosis not present

## 2017-10-13 DIAGNOSIS — D696 Thrombocytopenia, unspecified: Secondary | ICD-10-CM | POA: Diagnosis present

## 2017-10-13 DIAGNOSIS — K831 Obstruction of bile duct: Principal | ICD-10-CM | POA: Diagnosis present

## 2017-10-13 DIAGNOSIS — Z833 Family history of diabetes mellitus: Secondary | ICD-10-CM

## 2017-10-13 DIAGNOSIS — T380X5A Adverse effect of glucocorticoids and synthetic analogues, initial encounter: Secondary | ICD-10-CM | POA: Diagnosis present

## 2017-10-13 DIAGNOSIS — R600 Localized edema: Secondary | ICD-10-CM | POA: Diagnosis present

## 2017-10-13 DIAGNOSIS — R17 Unspecified jaundice: Secondary | ICD-10-CM | POA: Diagnosis not present

## 2017-10-13 DIAGNOSIS — E43 Unspecified severe protein-calorie malnutrition: Secondary | ICD-10-CM

## 2017-10-13 DIAGNOSIS — C251 Malignant neoplasm of body of pancreas: Secondary | ICD-10-CM | POA: Diagnosis not present

## 2017-10-13 DIAGNOSIS — R7989 Other specified abnormal findings of blood chemistry: Secondary | ICD-10-CM | POA: Diagnosis present

## 2017-10-13 DIAGNOSIS — E1165 Type 2 diabetes mellitus with hyperglycemia: Secondary | ICD-10-CM | POA: Diagnosis present

## 2017-10-13 DIAGNOSIS — D630 Anemia in neoplastic disease: Secondary | ICD-10-CM | POA: Diagnosis present

## 2017-10-13 DIAGNOSIS — Z9221 Personal history of antineoplastic chemotherapy: Secondary | ICD-10-CM

## 2017-10-13 DIAGNOSIS — G893 Neoplasm related pain (acute) (chronic): Secondary | ICD-10-CM | POA: Diagnosis present

## 2017-10-13 DIAGNOSIS — C250 Malignant neoplasm of head of pancreas: Secondary | ICD-10-CM | POA: Diagnosis not present

## 2017-10-13 DIAGNOSIS — K8689 Other specified diseases of pancreas: Secondary | ICD-10-CM

## 2017-10-13 DIAGNOSIS — R1084 Generalized abdominal pain: Secondary | ICD-10-CM | POA: Diagnosis not present

## 2017-10-13 DIAGNOSIS — C259 Malignant neoplasm of pancreas, unspecified: Secondary | ICD-10-CM | POA: Diagnosis present

## 2017-10-13 DIAGNOSIS — Z9889 Other specified postprocedural states: Secondary | ICD-10-CM | POA: Diagnosis not present

## 2017-10-13 DIAGNOSIS — E876 Hypokalemia: Secondary | ICD-10-CM | POA: Diagnosis present

## 2017-10-13 DIAGNOSIS — R300 Dysuria: Secondary | ICD-10-CM | POA: Diagnosis not present

## 2017-10-13 DIAGNOSIS — R109 Unspecified abdominal pain: Secondary | ICD-10-CM | POA: Diagnosis not present

## 2017-10-13 HISTORY — DX: Malignant neoplasm of pancreas, unspecified: C25.9

## 2017-10-13 LAB — CBC WITH DIFFERENTIAL/PLATELET
ABS IMMATURE GRANULOCYTES: 0.1 10*3/uL (ref 0.0–0.1)
BASOS PCT: 1 %
Basophils Absolute: 0 10*3/uL (ref 0.0–0.1)
Eosinophils Absolute: 0 10*3/uL (ref 0.0–0.7)
Eosinophils Relative: 0 %
HCT: 34.7 % — ABNORMAL LOW (ref 39.0–52.0)
Hemoglobin: 11.4 g/dL — ABNORMAL LOW (ref 13.0–17.0)
IMMATURE GRANULOCYTES: 1 %
LYMPHS PCT: 22 %
Lymphs Abs: 1.3 10*3/uL (ref 0.7–4.0)
MCH: 32.8 pg (ref 26.0–34.0)
MCHC: 32.9 g/dL (ref 30.0–36.0)
MCV: 99.7 fL (ref 78.0–100.0)
MONOS PCT: 9 %
Monocytes Absolute: 0.5 10*3/uL (ref 0.1–1.0)
NEUTROS ABS: 3.9 10*3/uL (ref 1.7–7.7)
NEUTROS PCT: 67 %
PLATELETS: 127 10*3/uL — AB (ref 150–400)
RBC: 3.48 MIL/uL — ABNORMAL LOW (ref 4.22–5.81)
RDW: 17.5 % — ABNORMAL HIGH (ref 11.5–15.5)
WBC: 5.8 10*3/uL (ref 4.0–10.5)

## 2017-10-13 LAB — COMPREHENSIVE METABOLIC PANEL
ALT: 208 U/L — ABNORMAL HIGH (ref 0–44)
ANION GAP: 7 (ref 5–15)
AST: 149 U/L — ABNORMAL HIGH (ref 15–41)
Albumin: 2.6 g/dL — ABNORMAL LOW (ref 3.5–5.0)
Alkaline Phosphatase: 434 U/L — ABNORMAL HIGH (ref 38–126)
BUN: 6 mg/dL — ABNORMAL LOW (ref 8–23)
CALCIUM: 8.3 mg/dL — AB (ref 8.9–10.3)
CHLORIDE: 97 mmol/L — AB (ref 98–111)
CO2: 28 mmol/L (ref 22–32)
Creatinine, Ser: 0.75 mg/dL (ref 0.61–1.24)
GFR calc non Af Amer: >60 mL/min (ref >60)
Glucose, Bld: 350 mg/dL — ABNORMAL HIGH (ref 70–99)
POTASSIUM: 3.5 mmol/L (ref 3.5–5.1)
SODIUM: 132 mmol/L — AB (ref 135–145)
Total Bilirubin: 4.6 mg/dL — ABNORMAL HIGH (ref 0.3–1.2)
Total Protein: 5.2 g/dL — ABNORMAL LOW (ref 6.5–8.1)

## 2017-10-13 LAB — HEMOGLOBIN A1C
HEMOGLOBIN A1C: 9.1 % — AB (ref 4.8–5.6)
Mean Plasma Glucose: 214.47 mg/dL

## 2017-10-13 LAB — GLUCOSE, CAPILLARY
GLUCOSE-CAPILLARY: 180 mg/dL — AB (ref 70–99)
Glucose-Capillary: 311 mg/dL — ABNORMAL HIGH (ref 70–99)

## 2017-10-13 LAB — APTT: APTT: 30 s (ref 24–36)

## 2017-10-13 LAB — PROTIME-INR
INR: 1.09
Prothrombin Time: 14 seconds (ref 11.4–15.2)

## 2017-10-13 MED ORDER — INSULIN ASPART 100 UNIT/ML ~~LOC~~ SOLN
11.0000 [IU] | Freq: Once | SUBCUTANEOUS | Status: AC
  Administered 2017-10-13: 11 [IU] via SUBCUTANEOUS

## 2017-10-13 MED ORDER — PANTOPRAZOLE SODIUM 40 MG PO TBEC
40.0000 mg | DELAYED_RELEASE_TABLET | Freq: Every day | ORAL | Status: DC
  Administered 2017-10-14 – 2017-10-17 (×4): 40 mg via ORAL
  Filled 2017-10-13 (×4): qty 1

## 2017-10-13 MED ORDER — SENNOSIDES-DOCUSATE SODIUM 8.6-50 MG PO TABS: 1.0000 | ORAL_TABLET | Freq: Every evening | ORAL | Status: DC | PRN

## 2017-10-13 MED ORDER — SODIUM CHLORIDE 0.9% FLUSH
10.0000 mL | INTRAVENOUS | Status: DC | PRN
  Administered 2017-10-16: 10 mL
  Administered 2017-10-17: 20 mL
  Administered 2017-10-17: 10 mL
  Filled 2017-10-13 (×3): qty 40

## 2017-10-13 MED ORDER — PANCRELIPASE (LIP-PROT-AMYL) 12000-38000 UNITS PO CPEP
36000.0000 [IU] | ORAL_CAPSULE | Freq: Two times a day (BID) | ORAL | Status: DC
  Filled 2017-10-13 (×3): qty 3

## 2017-10-13 MED ORDER — INSULIN ASPART 100 UNIT/ML ~~LOC~~ SOLN
0.0000 [IU] | Freq: Every day | SUBCUTANEOUS | Status: DC
  Administered 2017-10-14 – 2017-10-16 (×2): 2 [IU] via SUBCUTANEOUS

## 2017-10-13 MED ORDER — INSULIN ASPART 100 UNIT/ML ~~LOC~~ SOLN
0.0000 [IU] | Freq: Three times a day (TID) | SUBCUTANEOUS | Status: DC
  Administered 2017-10-14: 8 [IU] via SUBCUTANEOUS
  Administered 2017-10-14: 3 [IU] via SUBCUTANEOUS
  Administered 2017-10-14: 5 [IU] via SUBCUTANEOUS
  Administered 2017-10-15: 8 [IU] via SUBCUTANEOUS
  Administered 2017-10-15: 5 [IU] via SUBCUTANEOUS
  Administered 2017-10-15: 15 [IU] via SUBCUTANEOUS
  Administered 2017-10-16: 11 [IU] via SUBCUTANEOUS
  Administered 2017-10-16: 8 [IU] via SUBCUTANEOUS
  Administered 2017-10-17: 5 [IU] via SUBCUTANEOUS

## 2017-10-13 MED ORDER — PIPERACILLIN-TAZOBACTAM 3.375 G IVPB
3.3750 g | Freq: Three times a day (TID) | INTRAVENOUS | Status: DC
Start: 2017-10-13 — End: 2017-10-17
  Administered 2017-10-13 – 2017-10-17 (×11): 3.375 g via INTRAVENOUS
  Filled 2017-10-13 (×12): qty 50

## 2017-10-13 MED ORDER — DEXAMETHASONE 4 MG PO TABS: 2.0000 mg | ORAL_TABLET | Freq: Two times a day (BID) | ORAL | Status: DC

## 2017-10-13 MED ORDER — PANCRELIPASE (LIP-PROT-AMYL) 12000-38000 UNITS PO CPEP: 36000.0000 [IU] | ORAL_CAPSULE | ORAL | Status: DC

## 2017-10-13 MED ORDER — ONDANSETRON HCL 4 MG/2ML IJ SOLN: 4.0000 mg | Freq: Four times a day (QID) | INTRAMUSCULAR | Status: DC | PRN

## 2017-10-13 MED ORDER — POLYETHYLENE GLYCOL 3350 17 G PO PACK
17.0000 g | PACK | Freq: Every day | ORAL | Status: DC
  Administered 2017-10-17: 17 g via ORAL
  Filled 2017-10-13 (×5): qty 1

## 2017-10-13 MED ORDER — METHADONE HCL 10 MG PO TABS
5.0000 mg | ORAL_TABLET | Freq: Two times a day (BID) | ORAL | Status: DC
  Administered 2017-10-13 – 2017-10-17 (×8): 5 mg via ORAL
  Filled 2017-10-13 (×8): qty 1

## 2017-10-13 MED ORDER — OXYCODONE HCL 5 MG PO TABS
5.0000 mg | ORAL_TABLET | ORAL | Status: DC | PRN
  Administered 2017-10-16: 5 mg via ORAL
  Filled 2017-10-13: qty 1

## 2017-10-13 MED ORDER — OLANZAPINE 5 MG PO TABS
10.0000 mg | ORAL_TABLET | Freq: Every day | ORAL | Status: DC
  Administered 2017-10-13 – 2017-10-16 (×4): 10 mg via ORAL
  Filled 2017-10-13 (×4): qty 2

## 2017-10-13 MED ORDER — ENSURE ENLIVE PO LIQD
237.0000 mL | Freq: Two times a day (BID) | ORAL | Status: DC
  Administered 2017-10-14: 237 mL via ORAL

## 2017-10-13 MED ORDER — ONDANSETRON HCL 4 MG PO TABS: 4.0000 mg | ORAL_TABLET | Freq: Four times a day (QID) | ORAL | Status: DC | PRN

## 2017-10-13 MED ORDER — SODIUM CHLORIDE 0.45 % IV SOLN
INTRAVENOUS | Status: DC
  Administered 2017-10-13 – 2017-10-14 (×2): via INTRAVENOUS

## 2017-10-13 MED ORDER — PANCRELIPASE (LIP-PROT-AMYL) 12000-38000 UNITS PO CPEP
72000.0000 [IU] | ORAL_CAPSULE | Freq: Three times a day (TID) | ORAL | Status: DC
  Administered 2017-10-14 – 2017-10-16 (×8): 72000 [IU] via ORAL
  Filled 2017-10-13 (×8): qty 6

## 2017-10-13 MED ORDER — ENOXAPARIN SODIUM 40 MG/0.4ML ~~LOC~~ SOLN
40.0000 mg | SUBCUTANEOUS | Status: DC
Start: 2017-10-13 — End: 2017-10-13

## 2017-10-13 MED ORDER — FENTANYL 50 MCG/HR TD PT72
50.0000 ug | MEDICATED_PATCH | TRANSDERMAL | Status: DC
  Administered 2017-10-15: 50 ug via TRANSDERMAL
  Filled 2017-10-13: qty 1

## 2017-10-13 MED ORDER — DEXAMETHASONE 4 MG PO TABS
2.0000 mg | ORAL_TABLET | Freq: Every day | ORAL | Status: DC
  Administered 2017-10-14 – 2017-10-16 (×3): 2 mg via ORAL
  Filled 2017-10-13 (×3): qty 1

## 2017-10-13 NOTE — Progress Notes (Signed)
Admission note:  Arrival Method: Patient is a direct admit. Mental Orientation:  Alert and oriented x 4. Telemetry: N/A Assessment: See doc flow sheets Skin: WNL, dry skin IV: Porta cath accessed and 0.5NS at 75 ml/hr Pain: Denies any pain. Tubes: NOne. Safety Measures: Bed in low position, call bell and phone within reach. Fall Prevention Safety Plan: Reviewed the plan, verbalized understanding. Admission Screening: IN process 6700 Orientation: Patient has been oriented to the unit, staff and to the room.

## 2017-10-13 NOTE — Telephone Encounter (Signed)
I just reached out to the number and spoke with physician in [place]. Bilirubin rising to the 4s with increased discomfort and possible subjective fevers. Asked about expedited ERCP. Based on discussion with provider, I think if concern for cholangitis then reasonable to initiate antibiotics and based on trajectory decide on inpatient needs or not. I communicated to Dr. Lyndel Safe who is covering the hospital this weekend about  the patient. Based on clinical status if he is admitted at [facility] or [facility] then decision and evaluation will dictate need/timing of ERCP. Dr. Fuller Plan is on Monday and I'll be available as well in the hospitals if necessary. If he is not admitted, Patty can you reach out to patient and see how he is doing?

## 2017-10-13 NOTE — Telephone Encounter (Signed)
Dr Rush Landmark this pt is on your schedule for ERCP on 8/14.  Eugenio Hoes, NP is calling from [facility] asking if you could please call her to discuss the pt. [phone] ext: [phone]

## 2017-10-13 NOTE — H&P (Signed)
[facility] History and Physical  [name] GMW:[id number] DOB: 06-22-1950 DOA: 10/13/2017   PCP: [name], PA-C  Specialists: Patient is followed by Dr. Bobby Rumpf, oncologist at [facility]  Chief Complaint: Worsening abdominal pain  HPI: Tommy Oconnor is a 67 y.o. male with a past medical history significant mainly for stage III pancreatic cancer diagnosed about 2-3 months ago.  Patient is followed by Dr. Bobby Rumpf, oncology at [facility].  He has completed 3 cycles of chemotherapy.  He was about to start his fourth cycle however he complained of abdominal pain and was noted to be icteric.  A CT scan was repeated which showed mild enlargement of his pancreatic mass causing obstruction of biliary duct.  He was not given chemotherapy.  Gastroenterology in [place] was contacted and patient was scheduled to undergo ERCP with possible stent placement on August 14.  However patient's symptoms started getting worse.  He started developing more pain in the upper abdomen.  Pain was 7 out of 10 in intensity.  He underwent blood work, which apparently showed a worsening liver function tests including significantly elevated alkaline phosphatase and bilirubin.  There has been no mention of fever.  No nausea vomiting.  Some weight loss over the past few weeks.  Denies any headaches.  Does have poor appetite.  Due to worsening liver function tests and symptoms case was further discussed with gastroenterology and the patient was sent here for direct admission so that he could undergo ERCP on an expedited basis.  Home Medications: Prior to Admission medications   Medication Sig Start Date End Date Taking? Authorizing Provider  CREON 36000 units CPEP capsule Take 44,034-74,259 Units by mouth See admin instructions. Take 2 capsules (72,000 units) three times daily with meals & take 1 capsule (36,000 units) by mouth twice daily with snacks. 08/03/17   [provider]  dexamethasone (DECADRON) 4  MG tablet Take 2 mg by mouth daily. 09/11/17   [provider]  fentaNYL (DURAGESIC - DOSED MCG/HR) 50 MCG/HR Place 50 mcg onto the skin every 3 (three) days. 09/27/17   [provider]  furosemide (LASIX) 20 MG tablet Take 20 mg by mouth daily.    [provider]  methadone (DOLOPHINE) 5 MG tablet Take 5 mg by mouth 2 (two) times daily. 09/27/17   [provider]  OLANZapine (ZYPREXA) 10 MG tablet Take 10 mg by mouth at bedtime. 09/20/17   [provider]  omeprazole (PRILOSEC) 20 MG capsule Take 20 mg by mouth daily before breakfast.  07/06/17   [provider]  Potassium Chloride ER 20 MEQ TBCR Take 20 mEq by mouth daily. 09/20/17   [provider]    Allergies: No Known Allergies  Past Medical History: Past Medical History:  Diagnosis Date  . Cancer [facility])     Past Surgical History:  Procedure Laterality Date  . EUS N/A 08/03/2017   Procedure: UPPER ENDOSCOPIC ULTRASOUND (EUS) RADIAL;  Surgeon: Milus Banister, MD;  Location: [facility];  Service: Endoscopy;  Laterality: N/A;  . FINE NEEDLE ASPIRATION N/A 08/03/2017   Procedure: FINE NEEDLE ASPIRATION (FNA) LINEAR;  Surgeon: Milus Banister, MD;  Location: [facility];  Service: Endoscopy;  Laterality: N/A;    Social History: Patient denies any history of smoking alcohol use or illicit drug use.  Usually independent with daily activities.  Lives with his girlfriend.   Family History:  Family History  Problem Relation Age of Onset  . Diabetes Brother   His parents  died of "natural causes".  Review of Systems - History obtained from the patient General ROS: positive for  - fatigue and weight loss Psychological ROS: negative Ophthalmic ROS: negative ENT ROS: negative Allergy and Immunology ROS: negative Hematological and Lymphatic ROS: negative Endocrine ROS: negative Respiratory ROS: no cough, shortness of breath, or wheezing Cardiovascular ROS: no chest pain or  dyspnea on exertion Gastrointestinal ROS: as in hpi Genito-Urinary ROS: no dysuria, trouble voiding, or hematuria Musculoskeletal ROS: negative Neurological ROS: no TIA or stroke symptoms Dermatological ROS: negative  Physical Examination  Vitals:   10/13/17 1806  BP: 129/85  Pulse: 96  Resp: 18  Temp: 99.1 F (37.3 C)  TempSrc: Oral  SpO2: 97%    BP 129/85 (BP Location: Right Arm)   Pulse 96   Temp 99.1 F (37.3 C) (Oral)   Resp 18   SpO2 97%   General appearance: alert, cooperative, appears stated age, icteric and no distress Head: Normocephalic, without obvious abnormality, atraumatic Eyes: conjunctivae/corneas clear. PERRL, EOM's intact. Throat: Dry mucous membranes appreciated.  No oral lesions noted otherwise. Neck: no adenopathy, no carotid bruit, no JVD, supple, symmetrical, trachea midline and thyroid not enlarged, symmetric, no tenderness/mass/nodules Back: symmetric, no curvature. ROM normal. No CVA tenderness. Resp: clear to auscultation bilaterally Cardio: regular rate and rhythm, S1, S2 normal, no murmur, click, rub or gallop GI: .  Tenderness in the epigastric area without any rebound rigidity or guarding.  No masses or organomegaly appreciated.  Bowel sounds present.  No tenderness over the right upper quadrant. Extremities: Minimal edema lower extremities Pulses: 2+ and symmetric Skin: Skin color, texture, turgor normal. No rashes or lesions Lymph nodes: Cervical, supraclavicular, and axillary nodes normal. Neurologic: Alert and oriented x3.  No focal neurological deficits.    Labs on Admission:  Labs have been ordered.  Based on records sent from [facility] these are the results available from bloodwork done earlier today: WBC 6.8.  Hemoglobin 12.0.  Platelet count 142,000.  Sodium 130.  Potassium 3.6.  Chloride 96.  Bicarbonate 29.  BUN 14.  Creatinine 0.4.  Glucose 362.  Calcium 8.2.  Magnesium 2.0.  Total bilirubin 4.8.  AST 209.   ALT 258.  Alkaline phosphatase 513.  Total protein 5.8.  Albumin 3.2.     Problem List  Principal Problem:   Pancreatic cancer ([place]) Active Problems:   Abnormal LFTs   Cancer related pain   Biliary obstruction   Assessment: This is a 67 year old Caucasian male recently diagnosed with stage III pancreatic cancer who has completed 3 courses of chemotherapy with FOLFIRINOX.  Last cycle was about 3 weeks ago.  Comes in with abdominal pain and worsening LFTs, with concern for biliary obstruction.  Low suspicion for infection, however cannot be conclusively ruled out yet.  Plan:  #1 Pancreatic cancer, stage III with possible biliary obstruction/Abnormal LFT's: Patient will be admitted to the hospital.  Blood work will be ordered for tonight.  Discussed with Dr. Lyndel Safe with gastroenterology who will consult on the patient later tonight or tomorrow.  We will give him a diet for now.  Apparently tomorrow's schedule is full so earliest date that he could undergo ERCP is Sunday.  Give him IV fluids.  Dr. Lyndel Safe also recommends antibiotics for now.  We will place him on Zosyn.  Continue with Creon.  Also noted to be on dexamethasone which will be continued.  #2  Cancer related pain: Patient noted to be on fentanyl patch.  Also on  methadone which is apparently for neuropathic pain.  Continue these medications.  Bowel regimen.  #3 lower extremity edema: Attributed to steroids.  This has improved ever since the dose of dexamethasone was reduced.  We will hold his Lasix and potassium for now.  #4 hyperglycemia: Most likely secondary to steroids.  Place him on sensitive sliding scale for now.  Check HbA1c.  DVT Prophylaxis: SCDs Code Status: Full code Family Communication: Discussed with the patient Consults called: Gastroenterology: Dr. Lyndel Safe  Severity of Illness: The appropriate patient status for this patient is INPATIENT. Inpatient status is judged to be reasonable and necessary in order to  provide the required intensity of service to ensure the patient's safety. The patient's presenting symptoms, physical exam findings, and initial radiographic and laboratory data in the context of their chronic comorbidities is felt to place them at high risk for further clinical deterioration. Furthermore, it is not anticipated that the patient will be medically stable for discharge from the hospital within 2 midnights of admission. The following factors support the patient status of inpatient.   " The patient's presenting symptoms include abdominal pain, worsening LFTs. " The worrisome physical exam findings include abdominal tenderness, icterus. " The initial radiographic and laboratory data are worrisome because of acute biliary duct, abnormal LFTs. " The chronic co-morbidities include pancreatic cancer.   * I certify that at the point of admission it is my clinical judgment that the patient will require inpatient hospital care spanning beyond 2 midnights from the point of admission due to high intensity of service, high risk for further deterioration and high frequency of surveillance required.*  Further management decisions will depend on results of further testing and patient's response to treatment.   Bonnielee Haff  [facility] Pager [phone]  If 7PM-7AM, please contact night-coverage [email or web address] Password [email or web address]  10/13/2017, 6:09 PM

## 2017-10-13 NOTE — Progress Notes (Addendum)
ANTIBIOTIC CONSULT NOTE - INITIAL  Pharmacy Consult for zosyn Indication: ascending cholangitis  No Known Allergies  Patient Measurements:   Adjusted Body Weight: 77.6 kg in March 2019  Vital Signs:   Intake/Output from previous day: No intake/output data recorded. Intake/Output from this shift: No intake/output data recorded.  Labs: No results for input(s): WBC, HGB, PLT, LABCREA, CREATININE in the last 72 hours. CrCl cannot be calculated (No successful lab value found.). No results for input(s): VANCOTROUGH, VANCOPEAK, VANCORANDOM, GENTTROUGH, GENTPEAK, GENTRANDOM, TOBRATROUGH, TOBRAPEAK, TOBRARND, AMIKACINPEAK, AMIKACINTROU, AMIKACIN in the last 72 hours.   Microbiology: No results found for this or any previous visit (from the past 720 hour(s)).  Medical History: Past Medical History:  Diagnosis Date  . Cancer [facility])     Assessment: 67 yo male with little available clinical data or labs. NKA reported in chart. Pharmacy has been consulted for zosyn dosing for possible ascending cholangitis.   Plan:  Will initiate zosyn 3.375gm IV q8h for now and then f/u renal fxn.  Monitor clinical course, LOT, and fever curve  Ziyonna Christner A. Levada Dy, PharmD, [place] Pager: [phone] Please utilize Amion for appropriate phone number to reach the unit pharmacist ([place])   Addendum:  Scr 0.75, est CrCL~100, continue current dose.    10/13/2017,5:54 PM

## 2017-10-14 DIAGNOSIS — K831 Obstruction of bile duct: Principal | ICD-10-CM

## 2017-10-14 DIAGNOSIS — E43 Unspecified severe protein-calorie malnutrition: Secondary | ICD-10-CM

## 2017-10-14 DIAGNOSIS — R1084 Generalized abdominal pain: Secondary | ICD-10-CM

## 2017-10-14 DIAGNOSIS — R945 Abnormal results of liver function studies: Secondary | ICD-10-CM

## 2017-10-14 DIAGNOSIS — G893 Neoplasm related pain (acute) (chronic): Secondary | ICD-10-CM

## 2017-10-14 DIAGNOSIS — C259 Malignant neoplasm of pancreas, unspecified: Secondary | ICD-10-CM

## 2017-10-14 LAB — COMPREHENSIVE METABOLIC PANEL
ALBUMIN: 2.3 g/dL — AB (ref 3.5–5.0)
ALT: 178 U/L — ABNORMAL HIGH (ref 0–44)
ANION GAP: 7 (ref 5–15)
AST: 135 U/L — ABNORMAL HIGH (ref 15–41)
Alkaline Phosphatase: 400 U/L — ABNORMAL HIGH (ref 38–126)
BUN: 9 mg/dL (ref 8–23)
CALCIUM: 8.2 mg/dL — AB (ref 8.9–10.3)
CO2: 29 mmol/L (ref 22–32)
CREATININE: 0.65 mg/dL (ref 0.61–1.24)
Chloride: 98 mmol/L (ref 98–111)
GFR calc non Af Amer: >60 mL/min (ref >60)
Glucose, Bld: 252 mg/dL — ABNORMAL HIGH (ref 70–99)
Potassium: 3.5 mmol/L (ref 3.5–5.1)
SODIUM: 134 mmol/L — AB (ref 135–145)
TOTAL PROTEIN: 4.8 g/dL — AB (ref 6.5–8.1)
Total Bilirubin: 5 mg/dL — ABNORMAL HIGH (ref 0.3–1.2)

## 2017-10-14 LAB — GLUCOSE, CAPILLARY
Glucose-Capillary: 197 mg/dL — ABNORMAL HIGH (ref 70–99)
Glucose-Capillary: 254 mg/dL — ABNORMAL HIGH (ref 70–99)
Glucose-Capillary: 289 mg/dL — ABNORMAL HIGH (ref 70–99)
Glucose-Capillary: 231 mg/dL — ABNORMAL HIGH (ref 70–99)

## 2017-10-14 LAB — CBC
HCT: 33.3 % — ABNORMAL LOW (ref 39.0–52.0)
Hemoglobin: 11 g/dL — ABNORMAL LOW (ref 13.0–17.0)
MCH: 32.8 pg (ref 26.0–34.0)
MCHC: 33 g/dL (ref 30.0–36.0)
MCV: 99.4 fL (ref 78.0–100.0)
PLATELETS: 130 10*3/uL — AB (ref 150–400)
RBC: 3.35 MIL/uL — ABNORMAL LOW (ref 4.22–5.81)
RDW: 17.4 % — AB (ref 11.5–15.5)
WBC: 6.5 10*3/uL (ref 4.0–10.5)

## 2017-10-14 LAB — HIV ANTIBODY (ROUTINE TESTING W REFLEX): HIV SCREEN 4TH GENERATION: NONREACTIVE

## 2017-10-14 MED ORDER — INSULIN GLARGINE 100 UNIT/ML ~~LOC~~ SOLN
10.0000 [IU] | Freq: Every day | SUBCUTANEOUS | Status: DC
  Administered 2017-10-14 – 2017-10-15 (×2): 10 [IU] via SUBCUTANEOUS
  Filled 2017-10-14 (×2): qty 0.1

## 2017-10-14 MED ORDER — ENSURE ENLIVE PO LIQD
237.0000 mL | Freq: Three times a day (TID) | ORAL | Status: DC
  Administered 2017-10-14 – 2017-10-17 (×6): 237 mL via ORAL

## 2017-10-14 NOTE — Consult Note (Addendum)
Referring Provider: [facility]    Primary Care Physician:  [name], PA-C Primary Gastroenterologist:  Oretha Caprice, MD  Reason for Consultation:   Biliary obstruction / pancreatic   ASSESSMENT AND PLAN:     67 yo male with recently diagnosed pancreatic mass, getting chemotherapy, now admitted with progressive abdominal pain and biliary obstruction.  -continue Zosyn -plan is for ERCP with placement of biliary stent this admission.     Attending physician's note   I have taken an interval history, reviewed the chart and examined the patient. I agree with the Advanced Practitioner's note, impression and recommendations.   67 year old with stage III pancreatic adenocarcinoma on chemotherapy with obstructive jaundice. Abdominal pain has resolved. No ascending cholangitis. Patient on Zosyn. Planned ERCP for Monday with Dr Romero Belling.  Carmell Austria, MD   HPI: Tommy Oconnor is a 67 y.o. male who had an EUS with FNA by Dr. Ardis Hughs late May for evaluation of pancreatic head mass with major vessel involvement. FNA + for adenocarcinoma. He is followed by Oncologist Dr. Bobby Rumpf in [place] and is undergoing chemotherapy. He recently developed associated worsening abdominal pain / biliary obstruction and is scheduled to have stent placed this coming week with Dr. Rush Landmark.  We received a call yesterday from the cancer center in [place]. Bilirubin rising and patient having increasing abdominal discomfort. Out of concern for cholangitis patient was sent to [facility] for admission. Alk phos is 400, AST135, ALT 178, tbili 5. WBC normal. He denies chills. He has been having non-radiating mid upper abdominal pain . Eating okay, weight fluctuates.   No lower GI complaints  Past Medical History:  Diagnosis Date  . Pancreatic cancer ([place])    "stage III; dx'd ~ 07/2017"    Past Surgical History:  Procedure Laterality Date  . APPENDECTOMY    . EUS N/A 08/03/2017   Procedure: UPPER ENDOSCOPIC  ULTRASOUND (EUS) RADIAL;  Surgeon: Milus Banister, MD;  [facility];  Service: Endoscopy;  Laterality: N/A;  . FINE NEEDLE ASPIRATION N/A 08/03/2017   Procedure: FINE NEEDLE ASPIRATION (FNA) LINEAR;  Surgeon: Milus Banister, MD;  [facility];  Service: Endoscopy;  Laterality: N/A;    Prior to Admission medications   Medication Sig Start Date End Date Taking? Authorizing Provider  CREON 36000 units CPEP capsule Take 76,283-15,176 Units by mouth See admin instructions. Take 2 capsules (72,000 units) three times daily with meals & take 1 capsule (36,000 units) by mouth twice daily with snacks. 08/03/17  Yes [provider]  dexamethasone (DECADRON) 4 MG tablet Take 2 mg by mouth daily. 09/11/17  Yes [provider]  fentaNYL (DURAGESIC - DOSED MCG/HR) 50 MCG/HR Place 50 mcg onto the skin every 3 (three) days. 09/27/17  Yes [provider]  furosemide (LASIX) 20 MG tablet Take 20 mg by mouth daily.   Yes [provider]  methadone (DOLOPHINE) 5 MG tablet Take 5 mg by mouth 2 (two) times daily. 09/27/17  Yes [provider]  OLANZapine (ZYPREXA) 10 MG tablet Take 10 mg by mouth at bedtime. 09/20/17  Yes [provider]  omeprazole (PRILOSEC) 20 MG capsule Take 20 mg by mouth daily before breakfast.  07/06/17  Yes [provider]  Potassium Chloride ER 20 MEQ TBCR Take 20 mEq by mouth daily. 09/20/17  Yes [provider]    Current Facility-Administered Medications  Medication Dose Route Frequency Provider Last Rate Last Dose  . 0.45 % sodium chloride infusion   Intravenous Continuous [place] Pink,  Gokul, MD 75 mL/hr at 10/14/17 0953    . dexamethasone (DECADRON) tablet 2 mg  2 mg Oral Daily Schorr, Rhetta Mura, NP   2 mg at 10/14/17 0933  . feeding supplement (ENSURE ENLIVE) (ENSURE ENLIVE) liquid 237 mL  237 mL Oral BID BM Bonnielee Haff, MD   237 mL at 10/14/17 0951  . [START ON 10/15/2017] fentaNYL (DURAGESIC -  dosed mcg/hr) 50 mcg  50 mcg Transdermal Q72H Bonnielee Haff, MD      . insulin aspart (novoLOG) injection 0-15 Units  0-15 Units Subcutaneous TID WC Bonnielee Haff, MD   3 Units at 10/14/17 [id number]  . insulin aspart (novoLOG) injection 0-5 Units  0-5 Units Subcutaneous QHS Bonnielee Haff, MD      . lipase/protease/amylase (CREON) capsule 36,000 Units  36,000 Units Oral BID Bonnielee Haff, MD      . lipase/protease/amylase (CREON) capsule 72,000 Units  72,000 Units Oral TID WC Bonnielee Haff, MD   72,000 Units at 10/14/17 0933  . methadone (DOLOPHINE) tablet 5 mg  5 mg Oral BID Bonnielee Haff, MD   5 mg at 10/14/17 0934  . OLANZapine (ZYPREXA) tablet 10 mg  10 mg Oral QHS Bonnielee Haff, MD   10 mg at 10/13/17 2112  . ondansetron (ZOFRAN) tablet 4 mg  4 mg Oral Q6H PRN Bonnielee Haff, MD       Or  . ondansetron [facility]) injection 4 mg  4 mg Intravenous Q6H PRN Bonnielee Haff, MD      . oxyCODONE (Oxy IR/ROXICODONE) immediate release tablet 5 mg  5 mg Oral Q4H PRN Bonnielee Haff, MD      . pantoprazole (PROTONIX) EC tablet 40 mg  40 mg Oral Daily Bonnielee Haff, MD   40 mg at 10/14/17 0934  . piperacillin-tazobactam (ZOSYN) IVPB 3.375 g  3.375 g Intravenous Q8H Pierce, Dwayne A, RPH   Stopped at 10/14/17 0930  . polyethylene glycol (MIRALAX / GLYCOLAX) packet 17 g  17 g Oral Daily Bonnielee Haff, MD      . senna-docusate (Senokot-S) tablet 1 tablet  1 tablet Oral QHS PRN Bonnielee Haff, MD      . sodium chloride flush (NS) 0.9 % injection 10-40 mL  10-40 mL Intracatheter PRN Bonnielee Haff, MD        Allergies as of 10/13/2017  . (No Known Allergies)    Family History  Problem Relation Age of Onset  . Diabetes Brother     Social History   Socioeconomic History  . Marital status: Divorced    Spouse name: Not on file  . Number of children: Not on file  . Years of education: Not on file  . Highest education level: Not on file  Occupational History  . Not on file  Social  Needs  . Financial resource strain: Not on file  . Food insecurity:    Worry: Not on file    Inability: Not on file  . Transportation needs:    Medical: Not on file    Non-medical: Not on file  Tobacco Use  . Smoking status: Never Smoker  . Smokeless tobacco: Never Used  Substance and Sexual Activity  . Alcohol use: Never    Frequency: Never  . Drug use: Not Currently    Comment: 10/13/2017 "into drugs as a teenager; for ~ 1 yr"  . Sexual activity: Not Currently  Lifestyle  . Physical activity:    Days per week: Not on file    Minutes per session: Not on file  .  Stress: Not on file  Relationships  . Social connections:    Talks on phone: Not on file    Gets together: Not on file    Attends religious service: Not on file    Active member of club or organization: Not on file    Attends meetings of clubs or organizations: Not on file    Relationship status: Not on file  . Intimate partner violence:    Fear of current or ex partner: Not on file    Emotionally abused: Not on file    Physically abused: Not on file    Forced sexual activity: Not on file  Other Topics Concern  . Not on file  Social History Narrative  . Not on file    Review of Systems: All systems reviewed and negative except where noted in HPI.  Physical Exam: Vital signs in last 24 hours: Temp:  [98.5 F (36.9 C)-99.1 F (37.3 C)] 98.7 F (37.1 C) (08/10 0515) Pulse Rate:  [96-109] 106 (08/10 0515) Resp:  [14-18] 14 (08/10 0515) BP: (105-129)/(79-87) 105/79 (08/10 0515) SpO2:  [96 %-98 %] 96 % (08/10 0515) Weight:  [63.5 kg] 63.5 kg (08/10 0515) Last BM Date: 10/13/17 General:   Alert, thin white male in NAD Psych:  Pleasant, cooperative. Normal mood and affect. Eyes:  Pupils equal, + icterus.   Conjunctiva pink. Ears:  Normal auditory acuity. Nose:  No deformity, discharge,  or lesions. Neck:  Supple; no masses Lungs:  Clear throughout to auscultation.   No wheezes, crackles, or rhonchi.    Heart:  Regular rate and rhythm; no murmurs, no edema Abdomen:  Soft, non-distended, mild diffuse upper abdominal tenderness. BS active, no palp mass    Rectal:  Deferred  Msk:  Symmetrical without gross deformities. . Neurologic:  Alert and  oriented x4;  grossly normal neurologically. Skin:  Intact without significant lesions or rashes..   Intake/Output from previous day: 08/09 0701 - 08/10 0700 In: 661.1 [P.O.:120; I.V.:541.1] Out: -  Intake/Output this shift: Total I/O In: 941.2 [P.O.:250; I.V.:591.2; IV Piggyback:100] Out: -   Lab Results: Recent Labs    10/13/17 1928 10/14/17 0411  WBC 5.8 6.5  HGB 11.4* 11.0*  HCT 34.7* 33.3*  PLT 127* 130*   BMET Recent Labs    10/13/17 1928 10/14/17 0411  NA 132* 134*  K 3.5 3.5  CL 97* 98  CO2 28 29  GLUCOSE 350* 252*  BUN 6* 9  CREATININE 0.75 0.65  CALCIUM 8.3* 8.2*   LFT Recent Labs    10/14/17 0411  PROT 4.8*  ALBUMIN 2.3*  AST 135*  ALT 178*  ALKPHOS 400*  BILITOT 5.0*   PT/INR Recent Labs    10/13/17 1928  LABPROT 14.0  INR 1.09    Studies/Results: No results found.   Tye Savoy, NP-C @  10/14/2017, 10:01 AM

## 2017-10-14 NOTE — Plan of Care (Signed)

## 2017-10-14 NOTE — Progress Notes (Signed)
Initial Nutrition Assessment  DOCUMENTATION CODES:   Severe malnutrition in context of chronic illness  INTERVENTION:   Ensure Enlive po TID, each supplement provides 350 kcal and 20 grams of protein  NUTRITION DIAGNOSIS:   Severe Malnutrition related to chronic illness, cancer and cancer related treatments as evidenced by percent weight loss, energy intake < or equal to 75% for > or equal to 1 month, mild fat depletion, mild muscle depletion, moderate muscle depletion.  GOAL:   Patient will meet greater than or equal to 90% of their needs  MONITOR:   PO intake, Supplement acceptance, Weight trends, Labs  REASON FOR ASSESSMENT:   Malnutrition Screening Tool    ASSESSMENT:   Patient with PMH significant for stage III pancreatic cancer and is on his 3rd chemotherapy treatment. Presents this admission with abdominal discomfort. CT scan was repeated and showed mild enlargement of his pancreatic mass causing obstruction of biliary duct.  Plan for ERCP with stent placement this admission.    Pt endorses having a decreased appetite since being diagnosed with pancreatic cancer 3 months ago. States once he started chemotherapy his appetite declined even further due to taste changes. He is unable to describe taste changes. He eats 1 meal per day that consist of mostly fish and vegetables. He drinks 2 Boost daily. Pt had eggs, bacon, and grits this morning without complication. Discussed the importance of protein intake for preservation of lean body mass. Encouraged pt to increase Boost at home to 3-4/day to maximize protein and calories while intake is poor. Discussed eating smaller meals more frequently as tolerated. Pt amenable to Ensure this stay.  Pt reports a UBW of 170 lb this last time being at that weight 2-3 months ago. Records indicate pt weighed 171 lb 05/09/17 at [facility] and 140 lb this admission (18.1% wt loss in 5 months, significant for time frame). Nutrition-Focused physical exam  completed.   Medications reviewed and include: fentanyl, creon BID Labs reviewed: Na 134 (L) CBG 252 (H) AST 135 (H) ALT 178 (H)   NUTRITION - FOCUSED PHYSICAL EXAM:    Most Recent Value  Orbital Region  No depletion  Upper Arm Region  Mild depletion  Thoracic and Lumbar Region  Mild depletion  Buccal Region  No depletion  Temple Region  Mild depletion  Clavicle Bone Region  Moderate depletion  Clavicle and Acromion Bone Region  Moderate depletion  Scapular Bone Region  Mild depletion  Dorsal Hand  Mild depletion  Patellar Region  Mild depletion  Anterior Thigh Region  Mild depletion  Posterior Calf Region  Mild depletion  Edema (RD Assessment)  Mild  Hair  Reviewed  Eyes  Reviewed  Mouth  Reviewed  Skin  Reviewed  Nails  Reviewed     Diet Order:   Diet Order            DIET SOFT Room service appropriate? Yes; Fluid consistency: Thin  Diet effective now              EDUCATION NEEDS:   Education needs have been addressed  Skin:  Skin Assessment: Reviewed RN Assessment  Last BM:  10/13/17  Height:   Ht Readings from Last 1 Encounters:  10/14/17 5\' 6"  (1.676 m)    Weight:   Wt Readings from Last 1 Encounters:  10/14/17 63.5 kg    Ideal Body Weight:  64.5 kg  BMI:  Body mass index is 22.6 kg/m.  Estimated Nutritional Needs:   Kcal:  1950-2150 kcal  Protein:  95-120 grams   Fluid:  >/=1.9 L/day   Mariana Single RD, LDN Clinical Nutrition Pager # [phone]

## 2017-10-14 NOTE — Progress Notes (Signed)
PROGRESS NOTE    Tommy Oconnor  TGY:[id number] DOB: 1950-07-13 DOA: 10/13/2017 PCP: [name], PA-C  Brief Narrative:67 y.o. male with a past medical history significant mainly for stage III pancreatic cancer diagnosed about 2-3 months ago.  Patient is followed by Dr. Bobby Rumpf, oncology at [facility].  He has completed 3 cycles of chemotherapy.  He was about to start his fourth cycle however he complained of abdominal pain and was noted to be icteric.  A CT scan was repeated which showed mild enlargement of his pancreatic mass causing obstruction of biliary duct.  He was not given chemotherapy.  Gastroenterology in [place] was contacted and patient was scheduled to undergo ERCP with possible stent placement on August 14.  However patient's symptoms started getting worse.  He started developing more pain in the upper abdomen.  Pain was 7 out of 10 in intensity.  He underwent blood work, which apparently showed a worsening liver function tests.   Assessment & Plan:   1. Stage III pancreatic cancer with biliary obstruction/obstructive jaundice -CT scan at outside hospital showed enlargement of pancreatic mass causing obstruction of biliary ducts -clinically no evidence of acute cholangitis, however agree with empiric IV Zosyn for now  -Gastroenterology consulted, will need ERCP and stenting -Monitor LFTs, supportive care  2. Stage III pancreatic cancer -Suspect he is on dexamethasone due to recent ongoing chemotherapy, will continue -Follow-up with Dr. Bobby Rumpf at [facility] health post ERCP and stenting -He is also on fentanyl patch and methadone, both continued  3. Lower extremity edema -Was reportedly attributed to steroids, cut down IV fluids for now -Holding diuretics  4. Hyperglycemia/ -Due to steroids, could also have new-onset diabetes mellitus -hemoglobin A1c is 9.1 -Add Lantus, continue sliding scale insulin, diabetes coordinator consult  DVT prophylaxis:give single dose of  Lovenox today Code Status: for code Family Communication:no family at bedside Disposition Plan: home pending above workup  Consultants:   gastroenterology   Procedures:   Antimicrobials:  Antibiotics Given (last 72 hours)    Date/Time Action Medication Dose Rate   10/13/17 2123 New Bag/Given   piperacillin-tazobactam (ZOSYN) IVPB 3.375 g 3.375 g 12.5 mL/hr   10/14/17 0530 New Bag/Given   piperacillin-tazobactam (ZOSYN) IVPB 3.375 g 3.375 g 12.5 mL/hr      Subjective: -feels okay, no significant pain or distress, was able to eat breakfast  Objective: Vitals:   10/13/17 1806 10/13/17 2125 10/14/17 0515  BP: 129/85 127/87 105/79  Pulse: 96 (!) 109 (!) 106  Resp: 18 14 14   Temp: 99.1 F (37.3 C) 98.5 F (36.9 C) 98.7 F (37.1 C)  TempSrc: Oral Oral Oral  SpO2: 97% 98% 96%  Weight:   63.5 kg  Height:   5\' 6"  (1.676 m)    Intake/Output Summary (Last 24 hours) at 10/14/2017 1053 Last data filed at 10/14/2017 2876 Gross per 24 hour  Intake 1602.24 ml  Output -  Net 1602.24 ml   Filed Weights   10/14/17 0515  Weight: 63.5 kg    Examination:  General exam: ill-appearing middle-aged man, sitting comfortably in bed, no distress  Respiratory system: Clear to auscultation. Respiratory effort normal. Cardiovascular system: S1 & S2 heard, RRR. Gastrointestinal system: Abdomen is nondistended, soft and nontender.Normal bowel sounds heard. Central nervous system: Alert and oriented. No focal neurological deficits. Extremities: Symmetric 5 x 5 power. Skin: No rashes, lesions or ulcers Psychiatry: Judgement and insight appear normal. Mood & affect appropriate.     Data Reviewed:   CBC: Recent Labs  Lab 10/13/17 1928 10/14/17 0411  WBC 5.8 6.5  NEUTROABS 3.9  --   HGB 11.4* 11.0*  HCT 34.7* 33.3*  MCV 99.7 99.4  PLT 127* 314*   Basic Metabolic Panel: Recent Labs  Lab 10/13/17 1928 10/14/17 0411  NA 132* 134*  K 3.5 3.5  CL 97* 98  CO2 28 29  GLUCOSE  350* 252*  BUN 6* 9  CREATININE 0.75 0.65  CALCIUM 8.3* 8.2*   GFR: Estimated Creatinine Clearance: 80.5 mL/min (by C-G formula based on SCr of 0.65 mg/dL). Liver Function Tests: Recent Labs  Lab 10/13/17 1928 10/14/17 0411  AST 149* 135*  ALT 208* 178*  ALKPHOS 434* 400*  BILITOT 4.6* 5.0*  PROT 5.2* 4.8*  ALBUMIN 2.6* 2.3*   No results for input(s): LIPASE, AMYLASE in the last 168 hours. No results for input(s): AMMONIA in the last 168 hours. Coagulation Profile: Recent Labs  Lab 10/13/17 1928  INR 1.09   Cardiac Enzymes: No results for input(s): CKTOTAL, CKMB, CKMBINDEX, TROPONINI in the last 168 hours. BNP (last 3 results) No results for input(s): PROBNP in the last 8760 hours. HbA1C: Recent Labs    10/13/17 1928  HGBA1C 9.1*   CBG: Recent Labs  Lab 10/13/17 1809 10/13/17 2118 10/14/17 0742  GLUCAP 311* 180* 197*   Lipid Profile: No results for input(s): CHOL, HDL, LDLCALC, TRIG, CHOLHDL, LDLDIRECT in the last 72 hours. Thyroid Function Tests: No results for input(s): TSH, T4TOTAL, FREET4, T3FREE, THYROIDAB in the last 72 hours. Anemia Panel: No results for input(s): VITAMINB12, FOLATE, FERRITIN, TIBC, IRON, RETICCTPCT in the last 72 hours. Urine analysis: No results found for: COLORURINE, APPEARANCEUR, LABSPEC, PHURINE, GLUCOSEU, HGBUR, BILIRUBINUR, KETONESUR, PROTEINUR, UROBILINOGEN, NITRITE, LEUKOCYTESUR Sepsis Labs: @LABRCNTIP (procalcitonin:4,lacticidven:4)  )No results found for this or any previous visit (from the past 240 hour(s)).       Radiology Studies: No results found.      Scheduled Meds: . dexamethasone  2 mg Oral Daily  . feeding supplement (ENSURE ENLIVE)  237 mL Oral BID BM  . [START ON 10/15/2017] fentaNYL  50 mcg Transdermal Q72H  . insulin aspart  0-15 Units Subcutaneous TID WC  . insulin aspart  0-5 Units Subcutaneous QHS  . insulin glargine  10 Units Subcutaneous Daily  . lipase/protease/amylase  36,000 Units Oral  BID  . lipase/protease/amylase  72,000 Units Oral TID WC  . methadone  5 mg Oral BID  . OLANZapine  10 mg Oral QHS  . pantoprazole  40 mg Oral Daily  . polyethylene glycol  17 g Oral Daily   Continuous Infusions: . sodium chloride 10 mL/hr at 10/14/17 1046  . piperacillin-tazobactam (ZOSYN)  IV Stopped (10/14/17 0930)     LOS: 1 day    Time spent: 51min    Domenic Polite, MD [facility] Page via [email or web address], password [email or web address] After 7PM please contact night-coverage  10/14/2017, 10:53 AM

## 2017-10-15 DIAGNOSIS — E876 Hypokalemia: Secondary | ICD-10-CM

## 2017-10-15 LAB — CBC
HCT: 34.4 % — ABNORMAL LOW (ref 39.0–52.0)
Hemoglobin: 11.3 g/dL — ABNORMAL LOW (ref 13.0–17.0)
MCH: 33.2 pg (ref 26.0–34.0)
MCHC: 32.8 g/dL (ref 30.0–36.0)
MCV: 101.2 fL — ABNORMAL HIGH (ref 78.0–100.0)
PLATELETS: 125 10*3/uL — AB (ref 150–400)
RBC: 3.4 MIL/uL — ABNORMAL LOW (ref 4.22–5.81)
RDW: 17.8 % — AB (ref 11.5–15.5)
WBC: 6 10*3/uL (ref 4.0–10.5)

## 2017-10-15 LAB — COMPREHENSIVE METABOLIC PANEL
ALBUMIN: 2.2 g/dL — AB (ref 3.5–5.0)
ALT: 153 U/L — ABNORMAL HIGH (ref 0–44)
ANION GAP: 8 (ref 5–15)
AST: 108 U/L — ABNORMAL HIGH (ref 15–41)
Alkaline Phosphatase: 422 U/L — ABNORMAL HIGH (ref 38–126)
BUN: 10 mg/dL (ref 8–23)
CO2: 30 mmol/L (ref 22–32)
Calcium: 8.2 mg/dL — ABNORMAL LOW (ref 8.9–10.3)
Chloride: 97 mmol/L — ABNORMAL LOW (ref 98–111)
Creatinine, Ser: 0.58 mg/dL — ABNORMAL LOW (ref 0.61–1.24)
GFR calc Af Amer: >60 mL/min (ref >60)
GFR calc non Af Amer: >60 mL/min (ref >60)
Glucose, Bld: 236 mg/dL — ABNORMAL HIGH (ref 70–99)
POTASSIUM: 3.2 mmol/L — AB (ref 3.5–5.1)
SODIUM: 135 mmol/L (ref 135–145)
TOTAL PROTEIN: 4.7 g/dL — AB (ref 6.5–8.1)
Total Bilirubin: 5.3 mg/dL — ABNORMAL HIGH (ref 0.3–1.2)

## 2017-10-15 LAB — GLUCOSE, CAPILLARY
GLUCOSE-CAPILLARY: 215 mg/dL — AB (ref 70–99)
Glucose-Capillary: 292 mg/dL — ABNORMAL HIGH (ref 70–99)
Glucose-Capillary: 369 mg/dL — ABNORMAL HIGH (ref 70–99)
Glucose-Capillary: 184 mg/dL — ABNORMAL HIGH (ref 70–99)

## 2017-10-15 MED ORDER — POTASSIUM CHLORIDE CRYS ER 20 MEQ PO TBCR
40.0000 meq | EXTENDED_RELEASE_TABLET | Freq: Once | ORAL | Status: AC
  Administered 2017-10-15: 40 meq via ORAL
  Filled 2017-10-15: qty 2

## 2017-10-15 MED ORDER — INSULIN GLARGINE 100 UNIT/ML ~~LOC~~ SOLN
20.0000 [IU] | Freq: Every day | SUBCUTANEOUS | Status: DC
  Administered 2017-10-16: 20 [IU] via SUBCUTANEOUS
  Filled 2017-10-15: qty 0.2

## 2017-10-15 NOTE — Progress Notes (Signed)
PROGRESS NOTE    Tommy Oconnor  NFA:[id number] DOB: Jun 14, 1950 DOA: 10/13/2017 PCP: [name], PA-C  Brief Narrative:67 y.o. male with a past medical history significant mainly for stage III pancreatic cancer diagnosed about 2-3 months ago.  Patient is followed by Dr. Bobby Rumpf, oncology at [facility].  He has completed 3 cycles of chemotherapy.  He was about to start his fourth cycle however he complained of abdominal pain and was noted to be icteric.  A CT scan was repeated which showed mild enlargement of his pancreatic mass causing obstruction of biliary duct.  He was not given chemotherapy.  Gastroenterology in [place] was contacted and patient was scheduled to undergo ERCP with possible stent placement on August 14.  However patient's symptoms started getting worse.  He started developing more pain in the upper abdomen.  Pain was 7 out of 10 in intensity.  He underwent blood work, which apparently showed a worsening liver function tests.   Assessment & Plan:   1. Biliary obstruction/obstructive jaundice, from known pancreatic cancer -CT scan at outside hospital showed enlargement of pancreatic mass causing obstruction of biliary ducts -clinically no evidence of acute cholangitis, however agree with empiric IV Zosyn for now  -Gastroenterology consulted, plan for ERCP and stenting tomorrow -Monitor LFTs, supportive care  2. Stage III pancreatic cancer -Suspect he is on dexamethasone due to recent ongoing chemotherapy, will continue -Follow-up with Dr. Bobby Rumpf at [facility] health post ERCP and stenting -He is also on fentanyl patch and methadone, both continued  3. Lower extremity edema -Was reportedly attributed to steroids, cut down IV fluids for now -Holding diuretics  4. Hyperglycemia/ -Due to steroids, could also have new-onset diabetes mellitus -hemoglobin A1c is 9.1 -Add Lantus, continue sliding scale insulin, diabetes coordinator consult  Hypokalemia -will replace  Mild  anemia and thrombocytopenia -Due to underlying malignancy/chemotherapy -Stable, monitor  DVT prophylaxis:give another single dose of Lovenox today Code Status: for code Family Communication:no family at bedside Disposition Plan: home pending above workup  Consultants:   gastroenterology   Procedures:   Antimicrobials:  Antibiotics Given (last 72 hours)    Date/Time Action Medication Dose Rate   10/13/17 2123 New Bag/Given   piperacillin-tazobactam (ZOSYN) IVPB 3.375 g 3.375 g 12.5 mL/hr   10/14/17 0530 New Bag/Given   piperacillin-tazobactam (ZOSYN) IVPB 3.375 g 3.375 g 12.5 mL/hr   10/14/17 1619 New Bag/Given   piperacillin-tazobactam (ZOSYN) IVPB 3.375 g 3.375 g 12.5 mL/hr   10/14/17 2108 New Bag/Given   piperacillin-tazobactam (ZOSYN) IVPB 3.375 g 3.375 g 12.5 mL/hr   10/15/17 4696 New Bag/Given   piperacillin-tazobactam (ZOSYN) IVPB 3.375 g 3.375 g 12.5 mL/hr      Subjective: -feels well, no discomfort now  Objective: Vitals:   10/14/17 2051 10/15/17 0445 10/15/17 0957 10/15/17 0959  BP: 135/89 116/82  127/86  Pulse: 97 92 98 98  Resp: 16 16 16    Temp: 97.8 F (36.6 C) 98.5 F (36.9 C) 98.8 F (37.1 C)   TempSrc: Oral Oral Oral   SpO2: 100% 97% 99% 96%  Weight:      Height:        Intake/Output Summary (Last 24 hours) at 10/15/2017 1254 Last data filed at 10/15/2017 2952 Gross per 24 hour  Intake 711.23 ml  Output 0 ml  Net 711.23 ml   Filed Weights   10/14/17 0515  Weight: 63.5 kg    Examination:  Gen: Awake, Alert, Oriented X 3, chronically ill-appearing middle-aged man, no distress HEENT: PERRLA, Neck supple, no  JVD Lungs: Good air movement bilaterally, CTAB CVS: RRR,No Gallops,Rubs or new Murmurs Abd: soft, Non tender, non distended, BS present Extremities: No Cyanosis, Clubbing or edema Skin: no new rashes Psychiatry: Judgement and insight appear normal. Mood & affect appropriate.     Data Reviewed:   CBC: Recent Labs  Lab  10/13/17 1928 10/14/17 0411 10/15/17 0429  WBC 5.8 6.5 6.0  NEUTROABS 3.9  --   --   HGB 11.4* 11.0* 11.3*  HCT 34.7* 33.3* 34.4*  MCV 99.7 99.4 101.2*  PLT 127* 130* 008*   Basic Metabolic Panel: Recent Labs  Lab 10/13/17 1928 10/14/17 0411 10/15/17 0429  NA 132* 134* 135  K 3.5 3.5 3.2*  CL 97* 98 97*  CO2 28 29 30   GLUCOSE 350* 252* 236*  BUN 6* 9 10  CREATININE 0.75 0.65 0.58*  CALCIUM 8.3* 8.2* 8.2*   GFR: Estimated Creatinine Clearance: 80.5 mL/min (A) (by C-G formula based on SCr of 0.58 mg/dL (L)). Liver Function Tests: Recent Labs  Lab 10/13/17 1928 10/14/17 0411 10/15/17 0429  AST 149* 135* 108*  ALT 208* 178* 153*  ALKPHOS 434* 400* 422*  BILITOT 4.6* 5.0* 5.3*  PROT 5.2* 4.8* 4.7*  ALBUMIN 2.6* 2.3* 2.2*   No results for input(s): LIPASE, AMYLASE in the last 168 hours. No results for input(s): AMMONIA in the last 168 hours. Coagulation Profile: Recent Labs  Lab 10/13/17 1928  INR 1.09   Cardiac Enzymes: No results for input(s): CKTOTAL, CKMB, CKMBINDEX, TROPONINI in the last 168 hours. BNP (last 3 results) No results for input(s): PROBNP in the last 8760 hours. HbA1C: Recent Labs    10/13/17 1928  HGBA1C 9.1*   CBG: Recent Labs  Lab 10/14/17 1153 10/14/17 1655 10/14/17 2053 10/15/17 0736 10/15/17 1220  GLUCAP 254* 289* 231* 215* 292*   Lipid Profile: No results for input(s): CHOL, HDL, LDLCALC, TRIG, CHOLHDL, LDLDIRECT in the last 72 hours. Thyroid Function Tests: No results for input(s): TSH, T4TOTAL, FREET4, T3FREE, THYROIDAB in the last 72 hours. Anemia Panel: No results for input(s): VITAMINB12, FOLATE, FERRITIN, TIBC, IRON, RETICCTPCT in the last 72 hours. Urine analysis: No results found for: COLORURINE, APPEARANCEUR, LABSPEC, PHURINE, GLUCOSEU, HGBUR, BILIRUBINUR, KETONESUR, PROTEINUR, UROBILINOGEN, NITRITE, LEUKOCYTESUR Sepsis Labs: @LABRCNTIP (procalcitonin:4,lacticidven:4)  )No results found for this or any previous  visit (from the past 240 hour(s)).       Radiology Studies: No results found.      Scheduled Meds: . dexamethasone  2 mg Oral Daily  . feeding supplement (ENSURE ENLIVE)  237 mL Oral TID BM  . fentaNYL  50 mcg Transdermal Q72H  . insulin aspart  0-15 Units Subcutaneous TID WC  . insulin aspart  0-5 Units Subcutaneous QHS  . insulin glargine  10 Units Subcutaneous Daily  . lipase/protease/amylase  36,000 Units Oral BID  . lipase/protease/amylase  72,000 Units Oral TID WC  . methadone  5 mg Oral BID  . OLANZapine  10 mg Oral QHS  . pantoprazole  40 mg Oral Daily  . polyethylene glycol  17 g Oral Daily   Continuous Infusions: . sodium chloride 10 mL/hr at 10/15/17 0200  . piperacillin-tazobactam (ZOSYN)  IV 3.375 g (10/15/17 6761)     LOS: 2 days    Time spent: 47min    Domenic Polite, MD [facility] Page via [email or web address], password [email or web address] After 7PM please contact night-coverage  10/15/2017, 12:54 PM

## 2017-10-15 NOTE — Progress Notes (Addendum)
     [facility] Progress Note   Chief Complaint:   Pancreatic cancer, bile duct obstruction   SUBJECTIVE:   Feels okay, pain under control.     ASSESSMENT AND PLAN:   67 yo male with recently diagnosed stage III pancreatic mass, getting chemotherapy, now admitted with progressive abdominal pain and biliary obstruction without evidence for ascending cholangitis.  -pain under control now.  -continue Zosyn -Plan is for ERCP with stent placement tomorrow. The risks and benefits of ERCP were explained to the Mr. Fullen and he agrees to proceed.   Hypokalemia, mild. K+ 3.2 -primary team please replete so worsening hypokalemia doesn't interfere with procedure tomorrow   Attending physician's note   I have taken an interval history, reviewed the chart and examined the patient. I agree with the Advanced Practitioner's note, impression and recommendations.   67 year old with stage III pancreatic mass getting chemotherapy admitted with progressive biliary obstruction without any evidence of ascending cholangitis.  Pain well controlled.  Currently on Zosyn.  Plan: ERCP with stent placement tomorrow by Dr Leatha Gilding.  Carmell Austria, MD  OBJECTIVE:     Vital signs in last 24 hours: Temp:  [97.8 F (36.6 C)-98.8 F (37.1 C)] 98.8 F (37.1 C) (08/11 0957) Pulse Rate:  [92-102] 98 (08/11 0959) Resp:  [16] 16 (08/11 0957) BP: (116-135)/(79-89) 127/86 (08/11 0959) SpO2:  [96 %-100 %] 96 % (08/11 0959) Last BM Date: 10/14/17 General:   Thin male in NAD EENT:  Normal hearing,   Heart:  Regular rate and rhythm; no murmurs. No lower extremity edema Pulm: Normal respiratory effort. Abdomen:  Soft, nondistended, nontender.  Normal bowel sounds, no masses felt.     Neurologic:  Alert and  oriented x4;  grossly normal neurologically. Psych:  Pleasant, cooperative.  Normal mood and affect.   Intake/Output from previous day: 08/10 0701 - 08/11 0700 In: 1652.4 [P.O.:670;  I.V.:821.7; IV Piggyback:160.7] Out: 0  Intake/Output this shift: No intake/output data recorded.  Lab Results: Recent Labs    10/13/17 1928 10/14/17 0411 10/15/17 0429  WBC 5.8 6.5 6.0  HGB 11.4* 11.0* 11.3*  HCT 34.7* 33.3* 34.4*  PLT 127* 130* 125*   BMET Recent Labs    10/13/17 1928 10/14/17 0411 10/15/17 0429  NA 132* 134* 135  K 3.5 3.5 3.2*  CL 97* 98 97*  CO2 28 29 30   GLUCOSE 350* 252* 236*  BUN 6* 9 10  CREATININE 0.75 0.65 0.58*  CALCIUM 8.3* 8.2* 8.2*   LFT Recent Labs    10/15/17 0429  PROT 4.7*  ALBUMIN 2.2*  AST 108*  ALT 153*  ALKPHOS 422*  BILITOT 5.3*   PT/INR Recent Labs    10/13/17 1928  LABPROT 14.0  INR 1.09    Principal Problem:   Pancreatic cancer ([place]) Active Problems:   Abnormal LFTs   Cancer related pain   Biliary obstruction   Protein-calorie malnutrition, severe    LOS: 2 days   Tye Savoy ,NP 10/15/2017, 11:16 AM

## 2017-10-15 NOTE — H&P (View-Only) (Signed)
     [facility] Progress Note   Chief Complaint:   Pancreatic cancer, bile duct obstruction   SUBJECTIVE:   Feels okay, pain under control.     ASSESSMENT AND PLAN:   67 yo male with recently diagnosed stage III pancreatic mass, getting chemotherapy, now admitted with progressive abdominal pain and biliary obstruction without evidence for ascending cholangitis.  -pain under control now.  -continue Zosyn -Plan is for ERCP with stent placement tomorrow. The risks and benefits of ERCP were explained to the Mr. Shallenberger and he agrees to proceed.   Hypokalemia, mild. K+ 3.2 -primary team please replete so worsening hypokalemia doesn't interfere with procedure tomorrow   Attending physician's note   I have taken an interval history, reviewed the chart and examined the patient. I agree with the Advanced Practitioner's note, impression and recommendations.   67 year old with stage III pancreatic mass getting chemotherapy admitted with progressive biliary obstruction without any evidence of ascending cholangitis.  Pain well controlled.  Currently on Zosyn.  Plan: ERCP with stent placement tomorrow by Dr Leatha Gilding.  Carmell Austria, MD  OBJECTIVE:     Vital signs in last 24 hours: Temp:  [97.8 F (36.6 C)-98.8 F (37.1 C)] 98.8 F (37.1 C) (08/11 0957) Pulse Rate:  [92-102] 98 (08/11 0959) Resp:  [16] 16 (08/11 0957) BP: (116-135)/(79-89) 127/86 (08/11 0959) SpO2:  [96 %-100 %] 96 % (08/11 0959) Last BM Date: 10/14/17 General:   Thin male in NAD EENT:  Normal hearing,   Heart:  Regular rate and rhythm; no murmurs. No lower extremity edema Pulm: Normal respiratory effort. Abdomen:  Soft, nondistended, nontender.  Normal bowel sounds, no masses felt.     Neurologic:  Alert and  oriented x4;  grossly normal neurologically. Psych:  Pleasant, cooperative.  Normal mood and affect.   Intake/Output from previous day: 08/10 0701 - 08/11 0700 In: 1652.4 [P.O.:670;  I.V.:821.7; IV Piggyback:160.7] Out: 0  Intake/Output this shift: No intake/output data recorded.  Lab Results: Recent Labs    10/13/17 1928 10/14/17 0411 10/15/17 0429  WBC 5.8 6.5 6.0  HGB 11.4* 11.0* 11.3*  HCT 34.7* 33.3* 34.4*  PLT 127* 130* 125*   BMET Recent Labs    10/13/17 1928 10/14/17 0411 10/15/17 0429  NA 132* 134* 135  K 3.5 3.5 3.2*  CL 97* 98 97*  CO2 28 29 30   GLUCOSE 350* 252* 236*  BUN 6* 9 10  CREATININE 0.75 0.65 0.58*  CALCIUM 8.3* 8.2* 8.2*   LFT Recent Labs    10/15/17 0429  PROT 4.7*  ALBUMIN 2.2*  AST 108*  ALT 153*  ALKPHOS 422*  BILITOT 5.3*   PT/INR Recent Labs    10/13/17 1928  LABPROT 14.0  INR 1.09    Principal Problem:   Pancreatic cancer ([place]) Active Problems:   Abnormal LFTs   Cancer related pain   Biliary obstruction   Protein-calorie malnutrition, severe    LOS: 2 days   Tye Savoy ,NP 10/15/2017, 11:16 AM

## 2017-10-16 ENCOUNTER — Inpatient Hospital Stay (HOSPITAL_COMMUNITY): Payer: Medicare Other | Admitting: Certified Registered"

## 2017-10-16 ENCOUNTER — Encounter (HOSPITAL_COMMUNITY)
Admission: AD | Disposition: A | Discharge status: Home / Self Care | Payer: Self-pay | Source: Other Acute Inpatient Hospital | Attending: Internal Medicine

## 2017-10-16 ENCOUNTER — Encounter (HOSPITAL_COMMUNITY): Payer: Self-pay | Admitting: *Deleted

## 2017-10-16 ENCOUNTER — Inpatient Hospital Stay (HOSPITAL_COMMUNITY): Payer: Medicare Other

## 2017-10-16 DIAGNOSIS — C250 Malignant neoplasm of head of pancreas: Secondary | ICD-10-CM

## 2017-10-16 HISTORY — PX: ERCP: SHX5425

## 2017-10-16 HISTORY — PX: BILIARY STENT PLACEMENT: SHX5538

## 2017-10-16 HISTORY — PX: SPHINCTEROTOMY: SHX5544

## 2017-10-16 LAB — CBC
HCT: 34.4 % — ABNORMAL LOW (ref 39.0–52.0)
Hemoglobin: 11.2 g/dL — ABNORMAL LOW (ref 13.0–17.0)
MCH: 32.9 pg (ref 26.0–34.0)
MCHC: 32.6 g/dL (ref 30.0–36.0)
MCV: 101.2 fL — AB (ref 78.0–100.0)
PLATELETS: 127 10*3/uL — AB (ref 150–400)
RBC: 3.4 MIL/uL — ABNORMAL LOW (ref 4.22–5.81)
RDW: 17.9 % — AB (ref 11.5–15.5)
WBC: 6.8 10*3/uL (ref 4.0–10.5)

## 2017-10-16 LAB — COMPREHENSIVE METABOLIC PANEL
ALT: 152 U/L — ABNORMAL HIGH (ref 0–44)
AST: 120 U/L — ABNORMAL HIGH (ref 15–41)
Albumin: 2.2 g/dL — ABNORMAL LOW (ref 3.5–5.0)
Alkaline Phosphatase: 443 U/L — ABNORMAL HIGH (ref 38–126)
Anion gap: 7 (ref 5–15)
BUN: 14 mg/dL (ref 8–23)
CHLORIDE: 98 mmol/L (ref 98–111)
CO2: 29 mmol/L (ref 22–32)
Calcium: 8.3 mg/dL — ABNORMAL LOW (ref 8.9–10.3)
Creatinine, Ser: 0.55 mg/dL — ABNORMAL LOW (ref 0.61–1.24)
GFR calc non Af Amer: >60 mL/min (ref >60)
Glucose, Bld: 303 mg/dL — ABNORMAL HIGH (ref 70–99)
POTASSIUM: 4.3 mmol/L (ref 3.5–5.1)
Sodium: 134 mmol/L — ABNORMAL LOW (ref 135–145)
Total Bilirubin: 5.3 mg/dL — ABNORMAL HIGH (ref 0.3–1.2)
Total Protein: 4.9 g/dL — ABNORMAL LOW (ref 6.5–8.1)

## 2017-10-16 LAB — GLUCOSE, CAPILLARY
GLUCOSE-CAPILLARY: 189 mg/dL — AB (ref 70–99)
GLUCOSE-CAPILLARY: 246 mg/dL — AB (ref 70–99)
GLUCOSE-CAPILLARY: 272 mg/dL — AB (ref 70–99)
GLUCOSE-CAPILLARY: 221 mg/dL — AB (ref 70–99)
Glucose-Capillary: 305 mg/dL — ABNORMAL HIGH (ref 70–99)

## 2017-10-16 SURGERY — ERCP, WITH INTERVENTION IF INDICATED
Anesthesia: General

## 2017-10-16 MED ORDER — INDOMETHACIN 50 MG RE SUPP: 100.0000 mg | Freq: Once | RECTAL | Status: DC

## 2017-10-16 MED ORDER — ONDANSETRON HCL 4 MG/2ML IJ SOLN
INTRAMUSCULAR | Status: DC | PRN
  Administered 2017-10-16: 4 mg via INTRAVENOUS

## 2017-10-16 MED ORDER — INDOMETHACIN 50 MG RE SUPP
RECTAL | Status: DC | PRN
  Administered 2017-10-16: 100 mg via RECTAL

## 2017-10-16 MED ORDER — ESMOLOL HCL 100 MG/10ML IV SOLN
INTRAVENOUS | Status: DC | PRN
  Administered 2017-10-16 (×2): 15 mg via INTRAVENOUS

## 2017-10-16 MED ORDER — INSULIN GLARGINE 100 UNIT/ML ~~LOC~~ SOLN
30.0000 [IU] | Freq: Every day | SUBCUTANEOUS | Status: DC
  Filled 2017-10-16: qty 0.3

## 2017-10-16 MED ORDER — PHENYLEPHRINE 40 MCG/ML (10ML) SYRINGE FOR IV PUSH (FOR BLOOD PRESSURE SUPPORT)
PREFILLED_SYRINGE | INTRAVENOUS | Status: DC | PRN
  Administered 2017-10-16: 80 ug via INTRAVENOUS

## 2017-10-16 MED ORDER — DRONABINOL 2.5 MG PO CAPS
2.5000 mg | ORAL_CAPSULE | Freq: Every day | ORAL | Status: DC
  Administered 2017-10-17: 2.5 mg via ORAL
  Filled 2017-10-16 (×2): qty 1

## 2017-10-16 MED ORDER — PROPOFOL 10 MG/ML IV BOLUS
INTRAVENOUS | Status: DC | PRN
  Administered 2017-10-16: 150 mg via INTRAVENOUS
  Administered 2017-10-16: 40 mg via INTRAVENOUS

## 2017-10-16 MED ORDER — SODIUM CHLORIDE 0.9 % IV SOLN: INTRAVENOUS | Status: DC

## 2017-10-16 MED ORDER — LABETALOL HCL 5 MG/ML IV SOLN
5.0000 mg | INTRAVENOUS | Status: DC | PRN
  Administered 2017-10-16 (×3): 5 mg via INTRAVENOUS

## 2017-10-16 MED ORDER — INSULIN STARTER KIT- PEN NEEDLES (ENGLISH)
1.0000 | Freq: Once | Status: AC
  Administered 2017-10-17: 1
  Filled 2017-10-16 (×2): qty 1

## 2017-10-16 MED ORDER — LIVING WELL WITH DIABETES BOOK
Freq: Once | Status: AC
Start: 2017-10-16 — End: 2017-10-16
  Administered 2017-10-16: 18:00:00
  Filled 2017-10-16: qty 1

## 2017-10-16 MED ORDER — FENTANYL CITRATE (PF) 100 MCG/2ML IJ SOLN
INTRAMUSCULAR | Status: DC | PRN
  Administered 2017-10-16 (×2): 50 ug via INTRAVENOUS

## 2017-10-16 MED ORDER — DEXAMETHASONE 0.5 MG PO TABS
1.0000 mg | ORAL_TABLET | Freq: Every day | ORAL | Status: DC
  Administered 2017-10-17: 1 mg via ORAL
  Filled 2017-10-16: qty 2

## 2017-10-16 MED ORDER — LACTATED RINGERS IV SOLN
INTRAVENOUS | Status: AC | PRN
  Administered 2017-10-16: 1000 mL via INTRAVENOUS

## 2017-10-16 MED ORDER — LABETALOL HCL 5 MG/ML IV SOLN
INTRAVENOUS | Status: AC
  Filled 2017-10-16: qty 4

## 2017-10-16 MED ORDER — GLUCAGON HCL RDNA (DIAGNOSTIC) 1 MG IJ SOLR
INTRAMUSCULAR | Status: DC | PRN
  Administered 2017-10-16: 0.25 mg via INTRAVENOUS
  Administered 2017-10-16: .25 mg via INTRAVENOUS

## 2017-10-16 MED ORDER — MIDAZOLAM HCL 5 MG/5ML IJ SOLN
INTRAMUSCULAR | Status: DC | PRN
  Administered 2017-10-16: 2 mg via INTRAVENOUS

## 2017-10-16 MED ORDER — IOPAMIDOL (ISOVUE-300) INJECTION 61%
INTRAVENOUS | Status: AC
Start: 2017-10-16 — End: ?
  Filled 2017-10-16: qty 50

## 2017-10-16 MED ORDER — SODIUM CHLORIDE 0.9 % IV SOLN
INTRAVENOUS | Status: DC | PRN
  Administered 2017-10-16: 50 mL

## 2017-10-16 MED ORDER — GLUCAGON HCL RDNA (DIAGNOSTIC) 1 MG IJ SOLR
INTRAMUSCULAR | Status: AC
Start: 2017-10-16 — End: ?
  Filled 2017-10-16: qty 1

## 2017-10-16 MED ORDER — SUCCINYLCHOLINE CHLORIDE 20 MG/ML IJ SOLN
INTRAMUSCULAR | Status: DC | PRN
  Administered 2017-10-16: 100 mg via INTRAVENOUS

## 2017-10-16 MED ORDER — INDOMETHACIN 50 MG RE SUPP
RECTAL | Status: AC
  Filled 2017-10-16: qty 2

## 2017-10-16 NOTE — Anesthesia Preprocedure Evaluation (Addendum)
Anesthesia Evaluation  Patient identified by MRN, date of birth, ID band Patient awake    Reviewed: Allergy & Precautions, NPO status , Patient's Chart, lab work & pertinent test results  Airway Mallampati: I  TM Distance: >3 FB Neck ROM: Full    Dental  (+) Edentulous Upper, Edentulous Lower   Pulmonary neg pulmonary ROS,    breath sounds clear to auscultation       Cardiovascular Pt. on medications  Rhythm:Regular Rate:Normal     Neuro/Psych negative neurological ROS  negative psych ROS   GI/Hepatic Neg liver ROS, GERD  Medicated,  Endo/Other    Renal/GU      Musculoskeletal negative musculoskeletal ROS (+)   Abdominal Normal abdominal exam  (+)   Peds  Hematology   Anesthesia Other Findings   Reproductive/Obstetrics                            Lab Results  Component Value Date   WBC 6.8 10/16/2017   HGB 11.2 (L) 10/16/2017   HCT 34.4 (L) 10/16/2017   MCV 101.2 (H) 10/16/2017   PLT 127 (L) 10/16/2017   Lab Results  Component Value Date   CREATININE 0.55 (L) 10/16/2017   BUN 14 10/16/2017   NA 134 (L) 10/16/2017   K 4.3 10/16/2017   CL 98 10/16/2017   CO2 29 10/16/2017   Lab Results  Component Value Date   INR 1.09 10/13/2017     Anesthesia Physical Anesthesia Plan  ASA: II  Anesthesia Plan: General   Post-op Pain Management:    Induction: Rapid sequence and Cricoid pressure planned  PONV Risk Score and Plan: 3 and Ondansetron, Dexamethasone and Midazolam  Airway Management Planned: Oral ETT  Additional Equipment: None  Intra-op Plan:   Post-operative Plan: Extubation in OR  Informed Consent: I have reviewed the patients History and Physical, chart, labs and discussed the procedure including the risks, benefits and alternatives for the proposed anesthesia with the patient or authorized representative who has indicated his/her understanding and acceptance.      Plan Discussed with: CRNA  Anesthesia Plan Comments:        Anesthesia Quick Evaluation

## 2017-10-16 NOTE — Progress Notes (Addendum)
Inpatient Diabetes Program Recommendations  AACE/ADA: New Consensus Statement on Inpatient Glycemic Control (2019)  Target Ranges:  Prepandial:   less than 140 mg/dL      Peak postprandial:   less than 180 mg/dL (1-2 hours)      Critically ill patients:  140 - 180 mg/dL   Results for Tommy Oconnor, Tommy Oconnor (MRN [id number]) as of 10/16/2017 07:25  Ref. Range 10/16/2017 04:09  Glucose Latest Ref Range: 70 - 99 mg/dL 303 (H)   Results for Tommy, Oconnor (MRN [id number]) as of 10/16/2017 07:25  Ref. Range 10/15/2017 07:36 10/15/2017 12:20 10/15/2017 17:27 10/15/2017 20:46  Glucose-Capillary Latest Ref Range: 70 - 99 mg/dL 215 (H) 292 (H) 369 (H) 184 (H)    Review of Glycemic Control  Diabetes history: NO Outpatient Diabetes medications: NA Current orders for Inpatient glycemic control: Lantus 20 units daily, Novolog 0-15 units TID with meals, Novolog 0-5 units QHS; Decadron 2 mg daily  Inpatient Diabetes Program Recommendations:  Insulin - Basal: Noted Lantus was increased from 10 to 20 units daily on 10/15/17 to start today. Insulin - Meal Coverage: If Decadron is continued, please consider ordering Novolog 4 units TID with meals for meal coverage if patient eats at least 50% of meals. HgbA1C: A1C 9.1% on 10/13/2017 indicating an average glucose of 214 mg/dl. Elevated A1C likely due to recent dx of pancreatic cancer and steroids.  If MD is going to dx patient with new onset DM, please inform patient and nursing staff so patient can be educated.   NOTE: Noted consult for Diabetes Coordinator. Chart reviewed. Anticipate recent dx of pancreatic cancer and outpatient steroid use contributing to elevated A1C and glucose. MD, please note in progress note if patient will be dx with new onset DM. If so, please inform patient and bedside nursing staff so patient can be educated.  Addendum 10/16/17_0 :12-Talked with Dr. Broadus John and she plans to dx with new onset DM at this time and will inform patient this afternoon. Ordered  Living Well with Diabetes book, Insulin Starter kit, RD consult, patient education by bedside nursing, and diabetes education videos. Talked with Lanelle Bal, RN to inform her of discussion with Dr. Broadus John and requested that bedside nursing begin educating patient on DM and insulin once he is informed he is being newly dx with DM. Diabetes Coordinator will plan to see patient in the morning for re-enforce DM and insulin education.  Thanks, Tommy Alderman, RN, MSN, CDE Diabetes Coordinator Inpatient Diabetes Program [phone] (Team Pager from 8am to 5pm)

## 2017-10-16 NOTE — Telephone Encounter (Signed)
Patient case completed this AM at [facility]. Please cancel outpatient case. Once Provation note is complete, can we get the note to the referring MD? Thanks.

## 2017-10-16 NOTE — Progress Notes (Signed)
PROGRESS NOTE    Tommy Oconnor  RFF:[id number] DOB: 02/10/1951 DOA: 10/13/2017 PCP: [name], PA-C  Brief Narrative:67 y.o. male with a past medical history significant mainly for stage III pancreatic cancer diagnosed about 2-3 months ago.  Patient is followed by Dr. Bobby Rumpf, oncology at [facility].  He has completed 3 cycles of chemotherapy.  He was about to start his fourth cycle however he complained of abdominal pain and was noted to be icteric.  A CT scan was repeated which showed mild enlargement of his pancreatic mass causing obstruction of biliary duct.  He was not given chemotherapy.  Gastroenterology in [place] was contacted and patient was scheduled to undergo ERCP with possible stent placement on August 14.  However patient's symptoms started getting worse.  He started developing more pain in the upper abdomen.  Pain was 7 out of 10 in intensity.  He underwent blood work, which apparently showed worsening liver function tests.   Assessment & Plan:   Biliary obstruction/obstructive jaundice, from known pancreatic cancer -CT scan at outside hospital showed enlargement of pancreatic mass causing obstruction of biliary ducts -clinically no evidence of acute cholangitis, on empiric IV Zosyn day 3, stop tomorrow if stable -Gastroenterology consulted, plan for ERCP and stenting today -remains stable  Stage III pancreatic cancer -Suspect he is on dexamethasone due to recent ongoing chemotherapy, will continue -Follow-up with Dr. 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 -hemoglobin A1c is 9.1 -continue Lantus, continue sliding scale insulin, diabetes coordinator consulted -needs Insulin teaching  Hypokalemia -replaced  Mild anemia and  thrombocytopenia -Due to underlying malignancy/chemotherapy -Stable, monitor  DVT prophylaxis: resume lovenox tomorrow Code Status: for code Family Communication:no family at bedside Disposition Plan: home pending above workup  Consultants:   gastroenterology   Procedures:   Antimicrobials:  Antibiotics Given (last 72 hours)    Date/Time Action Medication Dose Rate   10/13/17 2123 New Bag/Given   piperacillin-tazobactam (ZOSYN) IVPB 3.375 g 3.375 g 12.5 mL/hr   10/14/17 0530 New Bag/Given   piperacillin-tazobactam (ZOSYN) IVPB 3.375 g 3.375 g 12.5 mL/hr   10/14/17 1619 New Bag/Given   piperacillin-tazobactam (ZOSYN) IVPB 3.375 g 3.375 g 12.5 mL/hr   10/14/17 2108 New Bag/Given   piperacillin-tazobactam (ZOSYN) IVPB 3.375 g 3.375 g 12.5 mL/hr   10/15/17 3570 New Bag/Given   piperacillin-tazobactam (ZOSYN) IVPB 3.375 g 3.375 g 12.5 mL/hr   10/15/17 1531 New Bag/Given   piperacillin-tazobactam (ZOSYN) IVPB 3.375 g 3.375 g 12.5 mL/hr   10/15/17 2328 New Bag/Given   piperacillin-tazobactam (ZOSYN) IVPB 3.375 g 3.375 g 12.5 mL/hr   10/16/17 1779 New Bag/Given   piperacillin-tazobactam (ZOSYN) IVPB 3.375 g 3.375 g 12.5 mL/hr   10/16/17 1443 New Bag/Given   piperacillin-tazobactam (ZOSYN) IVPB 3.375 g 3.375 g 12.5 mL/hr      Subjective: -feels well, no discomfort now, going down for ERCP  Objective: Vitals:   10/16/17 1015 10/16/17 1020 10/16/17 1030 10/16/17 1042  BP: (!) 172/107 (!) 164/106 (!) 157/107 (!) 150/98  Pulse: (!) 103 (!) 101 (!) 101 (!) 103  Resp: 12 14 18 18   Temp:    97.7 F (36.5 C)  TempSrc:    Oral  SpO2: 95% 94% 95% 93%  Weight:      Height:  Intake/Output Summary (Last 24 hours) at 10/16/2017 1503 Last data filed at 10/16/2017 1355 Gross per 24 hour  Intake 1422 ml  Output 0 ml  Net 1422 ml   Filed Weights   10/14/17 0515 10/16/17 0747  Weight: 63.5 kg 63.5 kg    Examination:  Gen: Awake, Alert, Oriented X 3, no distress HEENT:  PERRLA, Neck supple, no JVD Lungs: Good air movement bilaterally, CTAB CVS: RRR,No Gallops,Rubs or new Murmurs Abd: soft, Non tender, non distended, BS present Extremities: trace edema Skin: no new rashes Psychiatry: Judgement and insight appear normal. Mood & affect appropriate.     Data Reviewed:   CBC: Recent Labs  Lab 10/13/17 1928 10/14/17 0411 10/15/17 0429 10/16/17 0409  WBC 5.8 6.5 6.0 6.8  NEUTROABS 3.9  --   --   --   HGB 11.4* 11.0* 11.3* 11.2*  HCT 34.7* 33.3* 34.4* 34.4*  MCV 99.7 99.4 101.2* 101.2*  PLT 127* 130* 125* 737*   Basic Metabolic Panel: Recent Labs  Lab 10/13/17 1928 10/14/17 0411 10/15/17 0429 10/16/17 0409  NA 132* 134* 135 134*  K 3.5 3.5 3.2* 4.3  CL 97* 98 97* 98  CO2 28 29 30 29   GLUCOSE 350* 252* 236* 303*  BUN 6* 9 10 14   CREATININE 0.75 0.65 0.58* 0.55*  CALCIUM 8.3* 8.2* 8.2* 8.3*   GFR: Estimated Creatinine Clearance: 80.5 mL/min (A) (by C-G formula based on SCr of 0.55 mg/dL (L)). Liver Function Tests: Recent Labs  Lab 10/13/17 1928 10/14/17 0411 10/15/17 0429 10/16/17 0409  AST 149* 135* 108* 120*  ALT 208* 178* 153* 152*  ALKPHOS 434* 400* 422* 443*  BILITOT 4.6* 5.0* 5.3* 5.3*  PROT 5.2* 4.8* 4.7* 4.9*  ALBUMIN 2.6* 2.3* 2.2* 2.2*   No results for input(s): LIPASE, AMYLASE in the last 168 hours. No results for input(s): AMMONIA in the last 168 hours. Coagulation Profile: Recent Labs  Lab 10/13/17 1928  INR 1.09   Cardiac Enzymes: No results for input(s): CKTOTAL, CKMB, CKMBINDEX, TROPONINI in the last 168 hours. BNP (last 3 results) No results for input(s): PROBNP in the last 8760 hours. HbA1C: Recent Labs    10/13/17 1928  HGBA1C 9.1*   CBG: Recent Labs  Lab 10/15/17 1727 10/15/17 2046 10/16/17 0730 10/16/17 0753 10/16/17 1128  GLUCAP 369* 184* 246* 189* 272*   Lipid Profile: No results for input(s): CHOL, HDL, LDLCALC, TRIG, CHOLHDL, LDLDIRECT in the last 72 hours. Thyroid Function  Tests: No results for input(s): TSH, T4TOTAL, FREET4, T3FREE, THYROIDAB in the last 72 hours. Anemia Panel: No results for input(s): VITAMINB12, FOLATE, FERRITIN, TIBC, IRON, RETICCTPCT in the last 72 hours. Urine analysis: No results found for: COLORURINE, APPEARANCEUR, LABSPEC, PHURINE, GLUCOSEU, HGBUR, BILIRUBINUR, KETONESUR, PROTEINUR, UROBILINOGEN, NITRITE, LEUKOCYTESUR Sepsis Labs: @LABRCNTIP (procalcitonin:4,lacticidven:4)  )No results found for this or any previous visit (from the past 240 hour(s)).       Radiology Studies: Dg Ercp  Result Date: 10/16/2017 CLINICAL DATA:  Pancreatic carcinoma EXAM: ERCP TECHNIQUE: Multiple spot images obtained with the fluoroscopic device and submitted for interpretation post-procedure. COMPARISON:  None. FINDINGS: A series of fluoroscopic spot images document endoscopic cannulation and opacification of the CBD. There is a high-grade stenosis in the mid CBD. There is ectasia of the proximal CBD and central biliary confluence. Subsequent images document placement of metallic biliary stent through the mid and distal CBD across the lesion with balloon dilatation at the level of stenosis. There is incomplete opacification of the intrahepatic biliary tree, which appears  decompressed peripherally. No evidence of extravasation IMPRESSION: 1. Endoscopic metallic stent placement across high-grade CBD stenosis. These images were submitted for radiologic interpretation only. Please see the procedural report for the amount of contrast and the fluoroscopy time utilized. Electronically Signed   By: Lucrezia Europe M.D.   On: 10/16/2017 10:44        Scheduled Meds: . dexamethasone  2 mg Oral Daily  . feeding supplement (ENSURE ENLIVE)  237 mL Oral TID BM  . fentaNYL  50 mcg Transdermal Q72H  . indomethacin  100 mg Rectal Once  . insulin aspart  0-15 Units Subcutaneous TID WC  . insulin aspart  0-5 Units Subcutaneous QHS  . insulin glargine  20 Units Subcutaneous  Daily  . lipase/protease/amylase  36,000 Units Oral BID  . lipase/protease/amylase  72,000 Units Oral TID WC  . methadone  5 mg Oral BID  . OLANZapine  10 mg Oral QHS  . pantoprazole  40 mg Oral Daily  . polyethylene glycol  17 g Oral Daily   Continuous Infusions: . sodium chloride 10 mL/hr at 10/16/17 0609  . piperacillin-tazobactam (ZOSYN)  IV 3.375 g (10/16/17 1443)     LOS: 3 days    Time spent: 82min    Domenic Polite, MD [facility] Page via [email or web address], password [email or web address] After 7PM please contact night-coverage  10/16/2017, 3:03 PM

## 2017-10-16 NOTE — Progress Notes (Addendum)
Pt arrived to the unit from ERCP procedure- states he has pain in the middle lower abdomen 6/10 given pain meds as ordered. Pt is alert and oriented. He is has call light within reach, denies any other concerns, will continue to monitor.   Paulla Fore, RN

## 2017-10-16 NOTE — Progress Notes (Signed)
Pharmacy Antibiotic Note  Tommy Oconnor is a 67 y.o. male admitted on 10/13/2017 with known pancreatitic CA admitted with progressive abdominal pain and initial concern for ascending cholangitis however now with work-up showing no evidence for this however does have biliary obstruction. Pharmacy has been consulted for Zosyn dosing.  The patient is s/p ERCP today with stent placement. Today is Zosyn D#4 for empiric coverage. Tmax/24h: 99.5, WBC wnl, current dosing remains appropriate for renal function.  What is the plan for LOT - consider d/c and monitor off antibiotics?  Plan: - Continue Zosyn 3.375g IV every 8 hours (infused over 4 hours) - Consider addressing antibiotic LOT plans - Will continue to follow renal function, culture results, LOT, and antibiotic de-escalation plans   Height: 5\' 6"  (167.6 cm) Weight: 140 lb (63.5 kg) IBW/kg (Calculated) : 63.8  Temp (24hrs), Avg:98.1 F (36.7 C), Min:97.7 F (36.5 C), Max:99.5 F (37.5 C)  Recent Labs  Lab 10/13/17 1928 10/14/17 0411 10/15/17 0429 10/16/17 0409  WBC 5.8 6.5 6.0 6.8  CREATININE 0.75 0.65 0.58* 0.55*    Estimated Creatinine Clearance: 80.5 mL/min (A) (by C-G formula based on SCr of 0.55 mg/dL (L)).    No Known Allergies  Antimicrobials this admission: Zosyn 8/9 >>  Dose adjustments this admission: n/a  Microbiology results: None  Thank you for allowing pharmacy to be a part of this patient's care.  Alycia Rossetti, PharmD, BCPS Clinical Pharmacist Pager: [phone] Clinical phone for 10/16/2017 from 7a-3:30p: [phone] If after 3:30p, please call main pharmacy at: [phone] Please check AMION for all [place] numbers 10/16/2017 10:47 AM

## 2017-10-16 NOTE — Anesthesia Postprocedure Evaluation (Signed)
Anesthesia Post Note  Patient: Darrick Greenlaw  Procedure(s) Performed: ENDOSCOPIC RETROGRADE CHOLANGIOPANCREATOGRAPHY (ERCP) (N/A ) SPHINCTEROTOMY BILIARY STENT PLACEMENT BILIARY DILITATION (N/A )     Patient location during evaluation: PACU Anesthesia Type: General Level of consciousness: awake and alert Pain management: pain level controlled Vital Signs Assessment: post-procedure vital signs reviewed and stable Respiratory status: spontaneous breathing, nonlabored ventilation, respiratory function stable and patient connected to nasal cannula oxygen Cardiovascular status: blood pressure returned to baseline and stable Postop Assessment: no apparent nausea or vomiting Anesthetic complications: no    Last Vitals:  Vitals:   10/16/17 1030 10/16/17 1042  BP: (!) 157/107 (!) 150/98  Pulse: (!) 101 (!) 103  Resp: 18 18  Temp:  36.5 C  SpO2: 95% 93%    Last Pain:  Vitals:   10/16/17 1042  TempSrc: Oral  PainSc:                  Effie Berkshire

## 2017-10-16 NOTE — Progress Notes (Signed)
Brief Procedure Note (Provation Note to Follow)  Findings: The scout film was normal.    The esophagus was successfully intubated under direct vision without detailed examination of the pharynx, larynx, and associated structures.  The upper GI tract was traversed under direct vision without detailed examination.  The major papilla was normal.    A short 0.035 inch Soft Jagwire was passed into the biliary tree.  The short-nosed traction sphincterotome was passed over the guidewire and the bile duct was then deeply cannulated.  Contrast was injected.  I personally interpreted the bile duct images.  Ductal flow of contrast was adequate.  Image quality was adequate.  Contrast extended to the entire biliary tree. Opacification of the entire biliary tree was successful.  The upper third of the main bile duct and left and right hepatic ducts and all intrahepatic branches were severely dilated and diffusely dilated, secondary to a stricture.  The largest diameter was 16 mm.  The middle third of the main bile duct contained a single severe stenosis 6 mm in length.  A 6 mm biliary sphincterotomy was made with a monofilament short nose sphincterotome using ERBE electrocautery.  There was no post-sphincterotomy bleeding.  Occlusion cholangiogram was performed to better visualize the bile duct and outline the cystic duct. One [place] 10 mm by 6 cm uncovered metal biliary stent was placed into the common bile duct.  Bile flowed through the stent.  The stent was in good position.  Dilation of the middle third of the main bile duct with a Hurricane 6 mm balloon dilator was successful to the stricture to enhance opening of the stent.  Pancreatogram was not performed.   The endoscope was removed from the patient.   Impression: - The major papilla appeared normal.   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  The stricture was malignant appearing.  - The left and right  hepatic ducts and all intrahepatic branches and upper third of the main bile duct were severely dilated, secondary to a stricture.  - A biliary sphincterotomy was performed.  - One [place] uncovered metal biliary stent was placed into the common bile duct.  At the area of the stricture, this was further dilated to enhance opening of the SEMS.   Justice Britain, MD

## 2017-10-16 NOTE — Telephone Encounter (Signed)
FYI:  The pt is admitted in the hospital.

## 2017-10-16 NOTE — Progress Notes (Signed)
Call placed to Dr. Smith Robert advised BP 150/107 after 3 doses of labetolol, per Dr. Smith Robert patient may return to room at this time. Primary RN notified of this as well.

## 2017-10-16 NOTE — Telephone Encounter (Signed)
The case has been cancelled

## 2017-10-16 NOTE — Op Note (Signed)
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 Bobby Rumpf [facility]) Medicines:                General Anesthesia, Indomethacin 326 mg PR Complications:            No immediate complications. Estimated Blood Loss:     Estimated blood loss was minimal. Procedure:                Pre-Anesthesia Assessment:                           - Prior to the procedure, a History and Physical                            was performed, and patient medications and                            allergies were reviewed. The patient's tolerance of                            previous anesthesia was also reviewed. The risks                            and benefits of the procedure and the sedation                            options and risks were discussed with the patient.                            All questions were answered, and informed consent  was obtained. Prior Anticoagulants: The patient has                            taken no previous anticoagulant or antiplatelet                            agents. ASA Grade Assessment: III - A patient with                            severe systemic disease. After reviewing the risks                            and benefits, the  patient was deemed in                            satisfactory condition to undergo the procedure.                           After obtaining informed consent, the scope was                            passed under direct vision. Throughout the                            procedure, the patient's blood pressure, pulse, and                            oxygen saturations were monitored continuously. The                            [id number]-Q180V ([id number]) Olympus Duodensocope was                            introduced through the mouth, and used to inject                            contrast into and used to inject contrast into the                            bile duct. The ERCP was accomplished without                            difficulty. The patient tolerated the procedure                            well. Scope In: Scope Out: Findings:      The scout film was normal.      The esophagus was successfully intubated under direct vision without       detailed examination of the pharynx, larynx, and associated structures.       The upper GI tract was traversed under direct vision without detailed       examination. The major papilla was normal.      A short 0.035 inch Soft Jagwire was passed into the biliary tree. The  short-nosed traction sphincterotome was passed over the guidewire and       the bile duct was then deeply cannulated. Contrast was injected. I       personally interpreted the bile duct images. Ductal flow of contrast was       adequate. Image quality was adequate. Contrast extended to the entire       biliary tree. Opacification of the entire biliary tree was successful.       The upper third of the main bile duct and left and right hepatic ducts       and all intrahepatic branches were severely dilated and diffusely       dilated, secondary to a stricture. The largest diameter was 16 mm. The       middle third of the main bile duct contained a single severe stenosis 6       mm in  length. A 6 mm biliary sphincterotomy was made with a monofilament       short nose sphincterotome using ERBE electrocautery. There was no       post-sphincterotomy bleeding. Occlusion cholangiogram was performed to       better visualize the bile duct and outline the cystic duct. One [place]       10 mm by 6 cm uncovered metal biliary stent was placed into the common       bile duct. Bile flowed through the stent. The stent was in good       position. Dilation of the middle third of the main bile duct with a       Hurricane 6 mm balloon dilator was successful to the stricture to       enhance opening of the stent.      Pancreatogram was not performed.      The endoscope was removed from the patient. Impression:               - The major papilla appeared normal.                           - A single severe biliary stricture was found in                            the middle third of the main bile duct. The                            stricture was malignant appearing.                           - The left and right hepatic ducts and all                            intrahepatic branches and upper third of the main                            bile duct were severely dilated, secondary to a                            stricture.                           -  A biliary sphincterotomy was performed.                           - One [place] uncovered metal biliary stent was                            placed into the common bile duct. At the area of                            the stricture, this was further dilated to enhance                            opening of the SEMS. Recommendation:           - The patient will be observed post-procedure,                            until all discharge criteria are met.                           - Return patient to hospital ward for ongoing care.                           - Check liver enzymes (AST, ALT, alkaline                            phosphatase, bilirubin) in  the morning.                           - Observe patient's clinical course.                           - Clear liquid diet initially and then advance to                            low-fat diet thereafter.                           - Watch for pancreatitis, bleeding, perforation,                            and cholangitis.                           - The findings and recommendations were discussed                            with the patient.                           - The findings and recommendations were discussed                            with the patient's primary physician. Procedure Code(s):        --- Professional ---  29518, Endoscopic retrograde                            cholangiopancreatography (ERCP); with placement of                            endoscopic stent into biliary or pancreatic duct,                            including pre- and post-dilation and guide wire                            passage, when performed, including sphincterotomy,                            when performed, each stent Diagnosis Code(s):        --- Professional ---                           K83.1, Obstruction of bile duct                           R17, Unspecified jaundice                           R94.5, Abnormal results of liver function studies                           C25.0, Malignant neoplasm of head of pancreas                           C25.7, Malignant neoplasm of other parts of pancreas                           R93.5, Abnormal findings on diagnostic imaging of                            other abdominal regions, including retroperitoneum CPT copyright 2017 American Medical Association. All rights reserved. The codes documented in this report are preliminary and upon coder review may  be revised to meet current compliance requirements. Justice Britain, MD 10/16/2017 11:56:55 AM Number of Addenda: 0

## 2017-10-16 NOTE — Interval H&P Note (Signed)
History and Physical Interval Note:  10/16/2017 8:13 AM  Tommy Oconnor  has presented today for surgery, with the diagnosis of pancreatic cancer, biliary obstruction  The various methods of treatment have been discussed with the patient and family. After consideration of risks, benefits and other options for treatment, the patient has consented to  Procedure(s): ENDOSCOPIC RETROGRADE CHOLANGIOPANCREATOGRAPHY (ERCP) (N/A) as a surgical intervention .  The patient's history has been reviewed, patient examined, no change in status, stable for surgery.  I have reviewed the patient's chart and labs.  Questions were answered to the patient's satisfaction.     [employer]

## 2017-10-16 NOTE — Transfer of Care (Signed)
Immediate Anesthesia Transfer of Care Note  Patient: Tommy Oconnor  Procedure(s) Performed: ENDOSCOPIC RETROGRADE CHOLANGIOPANCREATOGRAPHY (ERCP) (N/A )  Patient Location: PACU  Anesthesia Type:General  Level of Consciousness: drowsy  Airway & Oxygen Therapy: Patient Spontanous Breathing and Patient connected to face mask oxygen  Post-op Assessment: Report given to RN and Post -op Vital signs reviewed and stable  Post vital signs: Reviewed and stable  Last Vitals:  Vitals Value Taken Time  BP    Temp    Pulse    Resp    SpO2      Last Pain:  Vitals:   10/16/17 0747  TempSrc: Oral  PainSc: 0-No pain      Patients Stated Pain Goal: 0 (62/44/69 5072)  Complications: No apparent anesthesia complications

## 2017-10-16 NOTE — Anesthesia Procedure Notes (Signed)
Procedure Name: Intubation Date/Time: 10/16/2017 8:37 AM Performed by: Cleda Daub, CRNA Pre-anesthesia Checklist: Patient identified, Emergency Drugs available, Suction available and Patient being monitored Patient Re-evaluated:Patient Re-evaluated prior to induction Oxygen Delivery Method: Circle system utilized Preoxygenation: Pre-oxygenation with 100% oxygen Induction Type: IV induction Laryngoscope Size: Mac and 3 Grade View: Grade I Tube type: Oral Tube size: 7.5 mm Number of attempts: 1 Airway Equipment and Method: Stylet Placement Confirmation: ETT inserted through vocal cords under direct vision,  positive ETCO2 and breath sounds checked- equal and bilateral Secured at: 23 cm Tube secured with: Tape Dental Injury: Teeth and Oropharynx as per pre-operative assessment

## 2017-10-17 ENCOUNTER — Encounter (HOSPITAL_COMMUNITY): Payer: Self-pay | Admitting: Gastroenterology

## 2017-10-17 DIAGNOSIS — K869 Disease of pancreas, unspecified: Secondary | ICD-10-CM

## 2017-10-17 DIAGNOSIS — Z9889 Other specified postprocedural states: Secondary | ICD-10-CM

## 2017-10-17 LAB — COMPREHENSIVE METABOLIC PANEL WITH GFR
ALT: 126 U/L — ABNORMAL HIGH (ref 0–44)
AST: 81 U/L — ABNORMAL HIGH (ref 15–41)
Albumin: 2.1 g/dL — ABNORMAL LOW (ref 3.5–5.0)
Alkaline Phosphatase: 345 U/L — ABNORMAL HIGH (ref 38–126)
Anion gap: 7 (ref 5–15)
BUN: 11 mg/dL (ref 8–23)
CO2: 31 mmol/L (ref 22–32)
Calcium: 8.2 mg/dL — ABNORMAL LOW (ref 8.9–10.3)
Chloride: 97 mmol/L — ABNORMAL LOW (ref 98–111)
Creatinine, Ser: 0.57 mg/dL — ABNORMAL LOW (ref 0.61–1.24)
GFR calc Af Amer: >60 mL/min
GFR calc non Af Amer: >60 mL/min
Glucose, Bld: 197 mg/dL — ABNORMAL HIGH (ref 70–99)
Potassium: 3.8 mmol/L (ref 3.5–5.1)
Sodium: 135 mmol/L (ref 135–145)
Total Bilirubin: 2.1 mg/dL — ABNORMAL HIGH (ref 0.3–1.2)
Total Protein: 4.7 g/dL — ABNORMAL LOW (ref 6.5–8.1)

## 2017-10-17 LAB — GLUCOSE, CAPILLARY
Glucose-Capillary: 99 mg/dL (ref 70–99)
Glucose-Capillary: 244 mg/dL — ABNORMAL HIGH (ref 70–99)

## 2017-10-17 LAB — CBC
HCT: 32.5 % — ABNORMAL LOW (ref 39.0–52.0)
Hemoglobin: 10.5 g/dL — ABNORMAL LOW (ref 13.0–17.0)
MCH: 32.9 pg (ref 26.0–34.0)
MCHC: 32.3 g/dL (ref 30.0–36.0)
MCV: 101.9 fL — ABNORMAL HIGH (ref 78.0–100.0)
Platelets: 128 K/uL — ABNORMAL LOW (ref 150–400)
RBC: 3.19 MIL/uL — ABNORMAL LOW (ref 4.22–5.81)
RDW: 17.6 % — ABNORMAL HIGH (ref 11.5–15.5)
WBC: 6.9 K/uL (ref 4.0–10.5)

## 2017-10-17 MED ORDER — INSULIN GLARGINE 100 UNIT/ML SOLOSTAR PEN: 20.0000 [IU] | PEN_INJECTOR | Freq: Every day | SUBCUTANEOUS | 0 refills | Status: DC

## 2017-10-17 MED ORDER — INSULIN GLARGINE 100 UNIT/ML ~~LOC~~ SOLN: 20.0000 [IU] | Freq: Every day | SUBCUTANEOUS | 1 refills | Status: DC

## 2017-10-17 MED ORDER — POLYETHYLENE GLYCOL 3350 17 G PO PACK: 17.0000 g | PACK | Freq: Every day | ORAL | 0 refills | Status: DC

## 2017-10-17 MED ORDER — INSULIN GLARGINE 100 UNIT/ML ~~LOC~~ SOLN
20.0000 [IU] | Freq: Every day | SUBCUTANEOUS | Status: DC
  Administered 2017-10-17: 20 [IU] via SUBCUTANEOUS
  Filled 2017-10-17: qty 0.2

## 2017-10-17 MED ORDER — HEPARIN SOD (PORK) LOCK FLUSH 100 UNIT/ML IV SOLN
500.0000 [IU] | INTRAVENOUS | Status: AC | PRN
  Administered 2017-10-17: 500 [IU]

## 2017-10-17 MED ORDER — INSULIN PEN NEEDLE 31G X 5 MM MISC: 0 refills | Status: DC

## 2017-10-17 MED ORDER — DEXAMETHASONE 1 MG PO TABS: 1.0000 mg | ORAL_TABLET | Freq: Every day | ORAL | 0 refills | Status: DC

## 2017-10-17 NOTE — Care Management Important Message (Signed)
Important Message  Patient Details  Name: Tommy Oconnor MRN: [id number] Date of Birth: February 22, 1951   Medicare Important Message Given:  Yes    [name] 10/17/2017, 2:04 PM

## 2017-10-17 NOTE — Discharge Summary (Signed)
Physician Discharge Summary  Tommy Oconnor BWG:[id number] DOB: [birth year] DOA: 10/13/2017  PCP: [name], PA-C  Admit date: 10/13/2017 Discharge date: 10/17/2017  Time spent: 35 minutes  Recommendations for Outpatient Follow-up:  1. Primary care physician in one week, please monitor CBGs closely and titrate down insulin dose as Decadron is slowly weaned off, he may be able to come off insulin when Decadron is stopped 2. Oncology Dr.Lewis in 1-2 weeks 3. 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 Patient is followed by Dr. Bobby Rumpf, oncology at [facility]. He has completed 3 cycles of chemotherapy. He was about to start his fourth cycle however hecomplained of abdominal painandwas noted to be icteric. A CT scan was repeated which showed mild enlargement of his pancreatic mass causing obstruction of biliary duct. He was not given chemotherapy[place] was [place] to undergo ERCP with possible stent placement on August 14. However patient's symptoms started getting worse. He started developing more pain in the upper abdomen. Pain was 7 out of 10 in intensity. He underwent blood work, which apparently showed worsening liver function tests  Hospital Course:   Biliary obstruction/obstructive jaundice, from known pancreatic cancer -CT scan at outside hospital showed enlargement of pancreatic mass causing  obstruction of biliary ducts -there was no evidence of acute cholangitis, with treated empirically with IV Zosyn given obstruction and immunosuppressed state, gastroenterology consulted -Underwent ERCP and stenting by Dr.Mansouraty on 8/12 -Clinically improved and stable now, LFTs  Improving -Tolerating soft diet -Follow-up with oncologist Dr.Lewis in 1-2weeks  Stage III pancreatic cancer -Followed by Dr. 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                           - A single severe biliary stricture was found in                            the middle third of the main bile duct. The  stricture was malignant appearing.                           - The left and right hepatic ducts and all                            intrahepatic branches and upper third of the main                            bile duct were severely dilated, secondary to a                            stricture.                           - A biliary sphincterotomy was performed.                           - One [place] uncovered metal biliary stent was                             placed into the common bile duct. At the area of                            the stricture, this was further dilated to enhance                             opening of the SEMS  Consultations:  Gastroenterology    Discharge Exam: Vitals:   10/17/17 0500 10/17/17 0818  BP: 95/68 100/73  Pulse: (!) 57 61  Resp: 18   Temp: 97.9 F (36.6 C) 97.9 F (36.6 C)  SpO2: 91% 99%    General: AAO 3 Cardiovascular: S1-S2/regular rate rhythm Respiratory: clear bilaterally  Discharge Instructions   Discharge Instructions    Diet - low sodium heart healthy   Complete by:  As directed    Increase activity slowly   Complete by:  As directed      Allergies as of 10/17/2017   No Known Allergies     Medication List    TAKE these medications   CREON 36000 UNITS Cpep capsule Generic drug:  lipase/protease/amylase Take 65,681-27,517 Units by mouth See admin instructions. Take 2 capsules (72,000 units) three times daily with meals & take 1 capsule (36,000 units) by mouth twice daily with snacks.   dexamethasone 1 MG tablet Commonly known as:  DECADRON Take 1 tablet (1 mg total) by mouth daily. Take 1mg  daily for 1 week then cut down to 0.5mg  daily for 1 week and then STOP Please remenber to cut down Insulin (Lantus dose) simultaneously Start taking on:  10/18/2017 What changed:    medication strength  how much to take  additional instructions   fentaNYL 50 MCG/HR Commonly known as:  DURAGESIC - dosed mcg/hr Place 50 mcg onto the skin every 3 (three) days.   furosemide 20 MG tablet Commonly known as:  LASIX Take 20 mg by mouth daily.   Insulin Glargine 100 UNIT/ML Solostar Pen Commonly known as:  LANTUS Inject 20 Units into the skin daily. When Decadron dose is lowered, his  Insulin dose should be cut down to 8-10units Daily instead of 20units   Insulin Pen Needle 31G X 5 MM Misc To be Used with Lantus pen-20units daily initially and then 10units daily in 1 week    methadone 5 MG tablet Commonly known as:  DOLOPHINE Take 5 mg by mouth 2 (two) times daily.   OLANZapine 10 MG tablet Commonly known as:  ZYPREXA Take 10 mg by mouth at bedtime.   omeprazole 20 MG capsule Commonly known as:  PRILOSEC Take 20 mg by mouth daily before breakfast.   polyethylene glycol packet Commonly known as:  MIRALAX / GLYCOLAX Take 17 g by mouth daily. Start taking on:  10/18/2017   Potassium Chloride ER 20 MEQ Tbcr Take 20 mEq by mouth daily.      No Known Allergies Follow-up Information    O'Buch, Greta, PA-C. Schedule an appointment as soon as possible for a visit in 1 week.   Specialty:  Internal Medicine Why:  Appointment date on 10/23/2017 at 2:00p. Please review Blood sugars with PCP Contact information: [address] [place] [address] [phone]        Lavera Guise A, MD. Schedule an appointment as soon as possible for a visit in 1 week.   Specialty:  Oncology Why:  Please call office to discuss a a follow up appointment. Thank you.  Contact information: [address] [place] [address] [phone]            The results of significant diagnostics from this hospitalization (including imaging, microbiology, ancillary and laboratory) are listed below for reference.    Significant Diagnostic Studies: Dg Ercp  Result Date: 10/16/2017 CLINICAL DATA:  Pancreatic carcinoma EXAM: ERCP TECHNIQUE: Multiple spot images obtained with the fluoroscopic device and submitted for interpretation post-procedure. COMPARISON:  None. FINDINGS: A series of fluoroscopic spot images document endoscopic cannulation and opacification of the CBD. There is a high-grade stenosis in the mid CBD. There is ectasia of the proximal CBD and central biliary confluence. Subsequent images document placement of metallic biliary stent through the mid and distal CBD across the lesion with balloon dilatation at the level of stenosis. There is  incomplete opacification of the intrahepatic biliary tree, which appears decompressed peripherally. No evidence of extravasation IMPRESSION: 1. Endoscopic metallic stent placement across high-grade CBD stenosis. These images were submitted for radiologic interpretation only. Please see the procedural report for the amount of contrast and the fluoroscopy time utilized. Electronically Signed   By: Lucrezia Europe M.D.   On: 10/16/2017 10:44    Microbiology: No results found for this or any previous visit (from the past 240 hour(s)).   Labs: Basic Metabolic Panel: Recent Labs  Lab 10/13/17 1928 10/14/17 0411 10/15/17 0429 10/16/17 0409 10/17/17 0308  NA 132* 134* 135 134* 135  K 3.5 3.5 3.2* 4.3 3.8  CL 97* 98 97* 98 97*  CO2 28 29 30 29 31   GLUCOSE 350* 252* 236* 303* 197*  BUN 6* 9 10 14 11   CREATININE 0.75 0.65 0.58* 0.55* 0.57*  CALCIUM 8.3* 8.2* 8.2* 8.3* 8.2*   Liver Function Tests: Recent Labs  Lab 10/13/17 1928 10/14/17 0411 10/15/17 0429 10/16/17 0409 10/17/17 0308  AST 149* 135* 108* 120* 81*  ALT 208* 178* 153* 152* 126*  ALKPHOS 434* 400* 422* 443* 345*  BILITOT 4.6* 5.0* 5.3* 5.3* 2.1*  PROT 5.2* 4.8* 4.7* 4.9* 4.7*  ALBUMIN 2.6* 2.3* 2.2* 2.2* 2.1*   No results for input(s): LIPASE,  AMYLASE in the last 168 hours. No results for input(s): AMMONIA in the last 168 hours. CBC: Recent Labs  Lab 10/13/17 1928 10/14/17 0411 10/15/17 0429 10/16/17 0409 10/17/17 0308  WBC 5.8 6.5 6.0 6.8 6.9  NEUTROABS 3.9  --   --   --   --   HGB 11.4* 11.0* 11.3* 11.2* 10.5*  HCT 34.7* 33.3* 34.4* 34.4* 32.5*  MCV 99.7 99.4 101.2* 101.2* 101.9*  PLT 127* 130* 125* 127* 128*   Cardiac Enzymes: No results for input(s): CKTOTAL, CKMB, CKMBINDEX, TROPONINI in the last 168 hours. BNP: BNP (last 3 results) No results for input(s): BNP in the last 8760 hours.  ProBNP (last 3 results) No results for input(s): PROBNP in the last 8760 hours.  CBG: Recent Labs  Lab  10/16/17 1128 10/16/17 1625 10/16/17 2058 10/17/17 0820 10/17/17 1252  GLUCAP 272* 305* 221* 99 244*       Signed:  Domenic Polite MD.  [facility] 10/17/2017, 3:05 PM

## 2017-10-17 NOTE — Progress Notes (Addendum)
Inpatient Diabetes Program Recommendations  AACE/ADA: New Consensus Statement on Inpatient Glycemic Control (2015)  Target Ranges:  Prepandial:   less than 140 mg/dL      Peak postprandial:   less than 180 mg/dL (1-2 hours)      Critically ill patients:  140 - 180 mg/dL   Results for Tommy Oconnor, Tommy Oconnor (MRN [id number]) as of 10/17/2017 09:51  Ref. Range 10/16/2017 07:53 10/16/2017 11:28 10/16/2017 16:25 10/16/2017 20:58 10/17/2017 08:20  Glucose-Capillary Latest Ref Range: 70 - 99 mg/dL 189 (H) 272 (H) 305 (H) 221 (H) 99   Review of Glycemic Control  Diabetes history: No Outpatient Diabetes medications: NA Current orders for Inpatient glycemic control: Lantus 30 units daily, Novolog 0-15 units TID with meals, Novolog 0-5 units QHS; Decadron 1 mg daily  Inpatient Diabetes Program Recommendations:  Insulin - Basal: Patient received Lantus 20 units on 10/16/17 and fasting glucose 99 mg/dl today. Please decrease Lantus back down to 20 units daily (starting now). Insulin - Meal Coverage: If Decadron is continued, please consider ordering Novolog 3 units TID with meals for meal coverage if patient eats at least 50% of meals. HgbA1C: A1C 9.1% on 10/13/2017 indicating new onset DM. Noted patient has recent dx of pancreatic cancer and has been taking Decadron as an outpatient. Patient will need to be educated on DM and follow up outpatient with PCP.  Addendum 10/17/17_0 :35-Spoke with patient about new diabetes diagnosis.  Discussed A1C results (9.1% on 10/13/17) and explained what an A1C is and informed patient that his current A1C indicates an average glucose of 214 mg/dl over the past 2-3 months. Discussed basic pathophysiology of DM Type 2, basic home care, importance of checking CBGs and maintaining good CBG control to prevent long-term and short-term complications. Reviewed glucose and A1C goals. Discussed how recent pancreatic cancer and Decadron use are likely cause of hyperglycemia and new onset DM.  Reviewed  signs and symptoms of hyperglycemia and hypoglycemia along with treatment for both. Discussed impact of nutrition, exercise, stress, sickness, and medications on diabetes control. Reviewed Living Well with diabetes booklet and encouraged patient to read through entire book. Informed patient that he will be prescribed insulin for outpatient DM control. Explained that if Decadron is stopped he may need adjustments with insulin. Discussed Lantus insulin in detail and explained that it should be consistently given about the same time everyday (Q24H).  Discussed insulin injections with vial/syringe and insulin pens and demonstrated both. Patient states that he prefers to use an insulin pen. Educated patient on insulin pen use at home. Reviewed contents of insulin flexpen starter kit. Reviewed all steps of insulin pen including attachment of needle, 2-unit air shot, dialing up dose, giving injection, removing needle, disposal of sharps, storage of unused insulin, disposal of insulin etc. Patient able to provide successful return demonstration.  Asked patient to check his glucose 4 times per day (before meals and at bedtime) and to keep a log book of glucose readings and insulin taken. Explained how the doctor he follows up with can use the log book to continue to make insulin adjustments if needed. Patient verbalized understanding of information discussed and he states that he has no further questions at this time related to diabetes.   RNs to provide ongoing basic DM education at bedside with this patient and engage patient to actively check blood glucose and administer insulin injections. At time of discharge please provide Rx for: glucometer, testing supplies, Lantus insulin pen and insulin pen needles  Thanks, Barnie Alderman, RN,  MSN, CDE Diabetes Coordinator Inpatient Diabetes Program [phone] (Team Pager from 8am to 5pm)

## 2017-10-17 NOTE — Progress Notes (Signed)
Progress Note   Subjective  Patient is doing well post ERCP yesterday. UCSEMS was placed into a mid-CBD stricture with balloon dilation of stricture leading to further stent expansion. Patient labs reviewed with decrease in Tb and other LFTs. Denies fevers/chills/nausea/vomiting. Able to tolerate a diet yesterday into today. Patient has some abdominal discomfort but continues to move towards baseline.   Objective   Vital signs in last 24 hours: Temp:  [97.4 F (36.3 C)-98.6 F (37 C)] 97.9 F (36.6 C) (08/13 0500) Pulse Rate:  [57-103] 57 (08/13 0500) Resp:  [18] 18 (08/13 0500) BP: (95-157)/(66-107) 95/68 (08/13 0500) SpO2:  [91 %-97 %] 91 % (08/13 0500) Last BM Date: 10/15/17 General: NAD Heart:  RR without R/Gs Lungs: No adventitious sounds present Abdomen:  NABS, soft, mild TTP in the midabdomen upon deep palpation, no rebound or guarding Extremities:  No LE edema Neurologic:  Alert and oriented x3 Psych:  Cooperative.  Normal mood and affect.  Intake/Output from previous day: 08/12 0701 - 08/13 0700 In: 1599.1 [P.O.:858; I.V.:616; IV Piggyback:125.1] Out: 0  Intake/Output this shift: No intake/output data recorded.  Lab Results: Recent Labs    10/15/17 0429 10/16/17 0409 10/17/17 0308  WBC 6.0 6.8 6.9  HGB 11.3* 11.2* 10.5*  HCT 34.4* 34.4* 32.5*  PLT 125* 127* 128*   BMET Recent Labs    10/15/17 0429 10/16/17 0409 10/17/17 0308  NA 135 134* 135  K 3.2* 4.3 3.8  CL 97* 98 97*  CO2 30 29 31   GLUCOSE 236* 303* 197*  BUN 10 14 11   CREATININE 0.58* 0.55* 0.57*  CALCIUM 8.2* 8.3* 8.2*   LFT Recent Labs    10/17/17 0308  PROT 4.7*  ALBUMIN 2.1*  AST 81*  ALT 126*  ALKPHOS 345*  BILITOT 2.1*   PT/INR No results for input(s): LABPROT, INR in the last 72 hours.  Studies/Results: Dg Ercp  Result Date: 10/16/2017 CLINICAL DATA:  Pancreatic carcinoma EXAM: ERCP TECHNIQUE: Multiple spot images obtained with the fluoroscopic device and  submitted for interpretation post-procedure. COMPARISON:  None. FINDINGS: A series of fluoroscopic spot images document endoscopic cannulation and opacification of the CBD. There is a high-grade stenosis in the mid CBD. There is ectasia of the proximal CBD and central biliary confluence. Subsequent images document placement of metallic biliary stent through the mid and distal CBD across the lesion with balloon dilatation at the level of stenosis. There is incomplete opacification of the intrahepatic biliary tree, which appears decompressed peripherally. No evidence of extravasation IMPRESSION: 1. Endoscopic metallic stent placement across high-grade CBD stenosis. These images were submitted for radiologic interpretation only. Please see the procedural report for the amount of contrast and the fluoroscopy time utilized. Electronically Signed   By: Lucrezia Europe M.D.   On: 10/16/2017 10:44     Assessment / Plan:   This is a 67 y.o. male with pmh Pancreatic Cancer who was admitted with abnormal LFTs and abnormal imaging suggesting new biliary ductal dilation; s/p ERCP with UCSEMS placed.  Overall, this patient is doing well with stent placement. I expect over the next 1-2 weeks for normalization of his LFT pattern which can be followed by his PCP or his local oncologist. No evidence of Post-ERCP Pancreatitis at this time. No evidence of cholangitis on ERCP yesterday, will defer antibiotics decision to primary medical service but could consider 5-7 day course overall. No follow up indicated unless concern for stent dysfunction and outpatient oncologist can reach out to Dr. Ardis Hughs  or myself as necessary.   Discharge Planning Diet: Low-Fat diet as tolerates Anticoagulation and antiplatelets: Minimize NSAIDs and VTE PPx for 48 hours from ERCP to decrease risk of post-sphincterotomy bleeding Follow up: PRN if stent dysfunction  Procedure Procedures planned and timing of procedure: N/A    LOS: 4 days    [facility]  10/17/2017, 10:20 AM [phone]

## 2017-10-18 ENCOUNTER — Encounter (HOSPITAL_COMMUNITY): Admission: RE | Payer: Self-pay | Source: Ambulatory Visit

## 2017-10-18 ENCOUNTER — Ambulatory Visit (HOSPITAL_COMMUNITY): Admission: RE | Admit: 2017-10-18 | Payer: Medicare Other | Source: Ambulatory Visit | Admitting: Gastroenterology

## 2017-10-18 DIAGNOSIS — Z6823 Body mass index (BMI) 23.0-23.9, adult: Secondary | ICD-10-CM | POA: Diagnosis not present

## 2017-10-18 DIAGNOSIS — C259 Malignant neoplasm of pancreas, unspecified: Secondary | ICD-10-CM | POA: Diagnosis not present

## 2017-10-18 DIAGNOSIS — Z79899 Other long term (current) drug therapy: Secondary | ICD-10-CM | POA: Diagnosis not present

## 2017-10-18 DIAGNOSIS — R739 Hyperglycemia, unspecified: Secondary | ICD-10-CM | POA: Diagnosis not present

## 2017-10-18 SURGERY — ENDOSCOPIC RETROGRADE CHOLANGIOPANCREATOGRAPHY (ERCP) WITH PROPOFOL
Anesthesia: General

## 2017-10-18 NOTE — Consult Note (Signed)
            [facility] CM Primary Care Navigator  10/18/2017  [name] 1950/11/23 [id number]   Went to see patient at the bedside to identify possible discharge needs but he was already discharged per staff. According to chart, patient was discharged home, declined home health services.  Per MD note, patientpresented with abdominal painandwas noted to be icteric with repeat CT scan showing mild enlargement of his pancreatic mass causing obstruction of biliary duct. Hissymptoms started getting worse developing more pain in the upper abdomen and underwent blood work, which apparently showed worsening liver function tests. (Biliary obstruction/ obstructive jaundice, from known pancreatic cancer stage III, Hyperglycemia/Steroid induced DM vs. New DM worsened by Steroids)  Patient has discharge instruction to follow-up withprimary care provider in 1 week day and oncology follow-up in 1 week.  Primary care provider's office called [facility]) to notifyofdischarge, need for post hospital follow-up and transition of care. Notified ofhealth issues needing close follow-up.   Made aware to refer patient to [facility] care management if deemed necessaryand appropriate for services.   For additional questions please contact:  Edwena Felty A. Trevonte Ashkar, BSN, RN-BC [facility] PRIMARY CARE Navigator Cell: [phone]

## 2017-10-20 DIAGNOSIS — C251 Malignant neoplasm of body of pancreas: Secondary | ICD-10-CM | POA: Diagnosis not present

## 2017-10-24 DIAGNOSIS — C251 Malignant neoplasm of body of pancreas: Secondary | ICD-10-CM | POA: Diagnosis not present

## 2017-10-25 DIAGNOSIS — R739 Hyperglycemia, unspecified: Secondary | ICD-10-CM | POA: Diagnosis not present

## 2017-10-25 DIAGNOSIS — Z6823 Body mass index (BMI) 23.0-23.9, adult: Secondary | ICD-10-CM | POA: Diagnosis not present

## 2017-10-25 DIAGNOSIS — C259 Malignant neoplasm of pancreas, unspecified: Secondary | ICD-10-CM | POA: Diagnosis not present

## 2017-10-26 DIAGNOSIS — Z510 Encounter for antineoplastic radiation therapy: Secondary | ICD-10-CM | POA: Diagnosis not present

## 2017-10-26 DIAGNOSIS — C251 Malignant neoplasm of body of pancreas: Secondary | ICD-10-CM | POA: Diagnosis not present

## 2017-10-30 DIAGNOSIS — Z510 Encounter for antineoplastic radiation therapy: Secondary | ICD-10-CM | POA: Diagnosis not present

## 2017-10-30 DIAGNOSIS — C251 Malignant neoplasm of body of pancreas: Secondary | ICD-10-CM | POA: Diagnosis not present

## 2017-10-30 DIAGNOSIS — Z5111 Encounter for antineoplastic chemotherapy: Secondary | ICD-10-CM | POA: Diagnosis not present

## 2017-10-31 DIAGNOSIS — Z510 Encounter for antineoplastic radiation therapy: Secondary | ICD-10-CM | POA: Diagnosis not present

## 2017-10-31 DIAGNOSIS — Z6823 Body mass index (BMI) 23.0-23.9, adult: Secondary | ICD-10-CM | POA: Diagnosis not present

## 2017-10-31 DIAGNOSIS — R739 Hyperglycemia, unspecified: Secondary | ICD-10-CM | POA: Diagnosis not present

## 2017-10-31 DIAGNOSIS — C259 Malignant neoplasm of pancreas, unspecified: Secondary | ICD-10-CM | POA: Diagnosis not present

## 2017-10-31 DIAGNOSIS — C251 Malignant neoplasm of body of pancreas: Secondary | ICD-10-CM | POA: Diagnosis not present

## 2017-11-01 DIAGNOSIS — Z510 Encounter for antineoplastic radiation therapy: Secondary | ICD-10-CM | POA: Diagnosis not present

## 2017-11-01 DIAGNOSIS — C251 Malignant neoplasm of body of pancreas: Secondary | ICD-10-CM | POA: Diagnosis not present

## 2017-11-02 DIAGNOSIS — C251 Malignant neoplasm of body of pancreas: Secondary | ICD-10-CM | POA: Diagnosis not present

## 2017-11-02 DIAGNOSIS — Z510 Encounter for antineoplastic radiation therapy: Secondary | ICD-10-CM | POA: Diagnosis not present

## 2017-11-03 DIAGNOSIS — C251 Malignant neoplasm of body of pancreas: Secondary | ICD-10-CM | POA: Diagnosis not present

## 2017-11-03 DIAGNOSIS — Z510 Encounter for antineoplastic radiation therapy: Secondary | ICD-10-CM | POA: Diagnosis not present

## 2017-11-07 DIAGNOSIS — C251 Malignant neoplasm of body of pancreas: Secondary | ICD-10-CM | POA: Diagnosis not present

## 2017-11-07 DIAGNOSIS — Z510 Encounter for antineoplastic radiation therapy: Secondary | ICD-10-CM | POA: Diagnosis not present

## 2017-11-07 DIAGNOSIS — Z5111 Encounter for antineoplastic chemotherapy: Secondary | ICD-10-CM | POA: Diagnosis not present

## 2017-11-08 DIAGNOSIS — Z510 Encounter for antineoplastic radiation therapy: Secondary | ICD-10-CM | POA: Diagnosis not present

## 2017-11-08 DIAGNOSIS — C251 Malignant neoplasm of body of pancreas: Secondary | ICD-10-CM | POA: Diagnosis not present

## 2017-11-09 DIAGNOSIS — C251 Malignant neoplasm of body of pancreas: Secondary | ICD-10-CM | POA: Diagnosis not present

## 2017-11-09 DIAGNOSIS — Z510 Encounter for antineoplastic radiation therapy: Secondary | ICD-10-CM | POA: Diagnosis not present

## 2017-11-10 DIAGNOSIS — Z510 Encounter for antineoplastic radiation therapy: Secondary | ICD-10-CM | POA: Diagnosis not present

## 2017-11-10 DIAGNOSIS — C251 Malignant neoplasm of body of pancreas: Secondary | ICD-10-CM | POA: Diagnosis not present

## 2017-11-13 DIAGNOSIS — D701 Agranulocytosis secondary to cancer chemotherapy: Secondary | ICD-10-CM | POA: Diagnosis not present

## 2017-11-13 DIAGNOSIS — C251 Malignant neoplasm of body of pancreas: Secondary | ICD-10-CM | POA: Diagnosis not present

## 2017-11-15 DIAGNOSIS — C251 Malignant neoplasm of body of pancreas: Secondary | ICD-10-CM | POA: Diagnosis not present

## 2017-11-15 DIAGNOSIS — Z510 Encounter for antineoplastic radiation therapy: Secondary | ICD-10-CM | POA: Diagnosis not present

## 2017-11-16 DIAGNOSIS — C251 Malignant neoplasm of body of pancreas: Secondary | ICD-10-CM | POA: Diagnosis not present

## 2017-11-16 DIAGNOSIS — Z510 Encounter for antineoplastic radiation therapy: Secondary | ICD-10-CM | POA: Diagnosis not present

## 2017-11-17 DIAGNOSIS — C251 Malignant neoplasm of body of pancreas: Secondary | ICD-10-CM | POA: Diagnosis not present

## 2017-11-17 DIAGNOSIS — Z510 Encounter for antineoplastic radiation therapy: Secondary | ICD-10-CM | POA: Diagnosis not present

## 2017-11-20 DIAGNOSIS — C251 Malignant neoplasm of body of pancreas: Secondary | ICD-10-CM | POA: Diagnosis not present

## 2017-11-20 DIAGNOSIS — Z5111 Encounter for antineoplastic chemotherapy: Secondary | ICD-10-CM | POA: Diagnosis not present

## 2017-11-20 DIAGNOSIS — Z510 Encounter for antineoplastic radiation therapy: Secondary | ICD-10-CM | POA: Diagnosis not present

## 2017-11-21 DIAGNOSIS — C251 Malignant neoplasm of body of pancreas: Secondary | ICD-10-CM | POA: Diagnosis not present

## 2017-11-21 DIAGNOSIS — C7951 Secondary malignant neoplasm of bone: Secondary | ICD-10-CM | POA: Diagnosis not present

## 2017-11-21 DIAGNOSIS — Z510 Encounter for antineoplastic radiation therapy: Secondary | ICD-10-CM | POA: Diagnosis not present

## 2017-11-22 DIAGNOSIS — C251 Malignant neoplasm of body of pancreas: Secondary | ICD-10-CM | POA: Diagnosis not present

## 2017-11-22 DIAGNOSIS — Z510 Encounter for antineoplastic radiation therapy: Secondary | ICD-10-CM | POA: Diagnosis not present

## 2017-11-23 DIAGNOSIS — C251 Malignant neoplasm of body of pancreas: Secondary | ICD-10-CM | POA: Diagnosis not present

## 2017-11-23 DIAGNOSIS — Z510 Encounter for antineoplastic radiation therapy: Secondary | ICD-10-CM | POA: Diagnosis not present

## 2017-11-24 DIAGNOSIS — C251 Malignant neoplasm of body of pancreas: Secondary | ICD-10-CM | POA: Diagnosis not present

## 2017-11-24 DIAGNOSIS — Z510 Encounter for antineoplastic radiation therapy: Secondary | ICD-10-CM | POA: Diagnosis not present

## 2017-11-27 DIAGNOSIS — C251 Malignant neoplasm of body of pancreas: Secondary | ICD-10-CM | POA: Diagnosis not present

## 2017-11-28 DIAGNOSIS — Z510 Encounter for antineoplastic radiation therapy: Secondary | ICD-10-CM | POA: Diagnosis not present

## 2017-11-28 DIAGNOSIS — C251 Malignant neoplasm of body of pancreas: Secondary | ICD-10-CM | POA: Diagnosis not present

## 2017-11-29 DIAGNOSIS — Z510 Encounter for antineoplastic radiation therapy: Secondary | ICD-10-CM | POA: Diagnosis not present

## 2017-11-29 DIAGNOSIS — C251 Malignant neoplasm of body of pancreas: Secondary | ICD-10-CM | POA: Diagnosis not present

## 2017-11-30 DIAGNOSIS — Z510 Encounter for antineoplastic radiation therapy: Secondary | ICD-10-CM | POA: Diagnosis not present

## 2017-11-30 DIAGNOSIS — C251 Malignant neoplasm of body of pancreas: Secondary | ICD-10-CM | POA: Diagnosis not present

## 2017-12-01 DIAGNOSIS — Z510 Encounter for antineoplastic radiation therapy: Secondary | ICD-10-CM | POA: Diagnosis not present

## 2017-12-01 DIAGNOSIS — C251 Malignant neoplasm of body of pancreas: Secondary | ICD-10-CM | POA: Diagnosis not present

## 2017-12-04 DIAGNOSIS — Z23 Encounter for immunization: Secondary | ICD-10-CM | POA: Diagnosis not present

## 2017-12-04 DIAGNOSIS — C251 Malignant neoplasm of body of pancreas: Secondary | ICD-10-CM | POA: Diagnosis not present

## 2017-12-05 DIAGNOSIS — Z510 Encounter for antineoplastic radiation therapy: Secondary | ICD-10-CM | POA: Diagnosis not present

## 2017-12-05 DIAGNOSIS — C251 Malignant neoplasm of body of pancreas: Secondary | ICD-10-CM | POA: Diagnosis not present

## 2017-12-06 DIAGNOSIS — C251 Malignant neoplasm of body of pancreas: Secondary | ICD-10-CM | POA: Diagnosis not present

## 2017-12-06 DIAGNOSIS — Z510 Encounter for antineoplastic radiation therapy: Secondary | ICD-10-CM | POA: Diagnosis not present

## 2017-12-07 DIAGNOSIS — Z510 Encounter for antineoplastic radiation therapy: Secondary | ICD-10-CM | POA: Diagnosis not present

## 2017-12-07 DIAGNOSIS — C251 Malignant neoplasm of body of pancreas: Secondary | ICD-10-CM | POA: Diagnosis not present

## 2017-12-08 DIAGNOSIS — C251 Malignant neoplasm of body of pancreas: Secondary | ICD-10-CM | POA: Diagnosis not present

## 2017-12-08 DIAGNOSIS — Z510 Encounter for antineoplastic radiation therapy: Secondary | ICD-10-CM | POA: Diagnosis not present

## 2017-12-11 DIAGNOSIS — R112 Nausea with vomiting, unspecified: Secondary | ICD-10-CM | POA: Diagnosis not present

## 2017-12-11 DIAGNOSIS — D649 Anemia, unspecified: Secondary | ICD-10-CM | POA: Diagnosis not present

## 2017-12-11 DIAGNOSIS — C251 Malignant neoplasm of body of pancreas: Secondary | ICD-10-CM | POA: Diagnosis not present

## 2017-12-11 DIAGNOSIS — R1084 Generalized abdominal pain: Secondary | ICD-10-CM | POA: Diagnosis not present

## 2017-12-11 DIAGNOSIS — K8689 Other specified diseases of pancreas: Secondary | ICD-10-CM | POA: Diagnosis not present

## 2017-12-12 DIAGNOSIS — Z5111 Encounter for antineoplastic chemotherapy: Secondary | ICD-10-CM | POA: Diagnosis not present

## 2017-12-12 DIAGNOSIS — C251 Malignant neoplasm of body of pancreas: Secondary | ICD-10-CM | POA: Diagnosis not present

## 2017-12-12 DIAGNOSIS — Z510 Encounter for antineoplastic radiation therapy: Secondary | ICD-10-CM | POA: Diagnosis not present

## 2017-12-13 DIAGNOSIS — Z5111 Encounter for antineoplastic chemotherapy: Secondary | ICD-10-CM | POA: Diagnosis not present

## 2017-12-13 DIAGNOSIS — C251 Malignant neoplasm of body of pancreas: Secondary | ICD-10-CM | POA: Diagnosis not present

## 2017-12-13 DIAGNOSIS — Z510 Encounter for antineoplastic radiation therapy: Secondary | ICD-10-CM | POA: Diagnosis not present

## 2018-01-05 DIAGNOSIS — C251 Malignant neoplasm of body of pancreas: Secondary | ICD-10-CM | POA: Diagnosis not present

## 2018-01-05 DIAGNOSIS — C259 Malignant neoplasm of pancreas, unspecified: Secondary | ICD-10-CM | POA: Diagnosis not present

## 2018-01-05 DIAGNOSIS — I700 Atherosclerosis of aorta: Secondary | ICD-10-CM | POA: Diagnosis not present

## 2018-01-08 DIAGNOSIS — C251 Malignant neoplasm of body of pancreas: Secondary | ICD-10-CM | POA: Diagnosis not present

## 2018-01-08 DIAGNOSIS — G893 Neoplasm related pain (acute) (chronic): Secondary | ICD-10-CM | POA: Diagnosis not present

## 2018-01-15 DIAGNOSIS — R748 Abnormal levels of other serum enzymes: Secondary | ICD-10-CM | POA: Diagnosis not present

## 2018-01-15 DIAGNOSIS — C251 Malignant neoplasm of body of pancreas: Secondary | ICD-10-CM

## 2018-01-15 DIAGNOSIS — R945 Abnormal results of liver function studies: Secondary | ICD-10-CM

## 2018-01-18 DIAGNOSIS — C259 Malignant neoplasm of pancreas, unspecified: Secondary | ICD-10-CM | POA: Diagnosis not present

## 2018-01-18 DIAGNOSIS — Z79899 Other long term (current) drug therapy: Secondary | ICD-10-CM | POA: Diagnosis not present

## 2018-01-18 DIAGNOSIS — K219 Gastro-esophageal reflux disease without esophagitis: Secondary | ICD-10-CM | POA: Diagnosis not present

## 2018-01-18 DIAGNOSIS — R739 Hyperglycemia, unspecified: Secondary | ICD-10-CM | POA: Diagnosis not present

## 2018-01-18 DIAGNOSIS — E785 Hyperlipidemia, unspecified: Secondary | ICD-10-CM | POA: Diagnosis not present

## 2018-01-22 DIAGNOSIS — C251 Malignant neoplasm of body of pancreas: Secondary | ICD-10-CM | POA: Diagnosis not present

## 2018-01-24 DIAGNOSIS — R935 Abnormal findings on diagnostic imaging of other abdominal regions, including retroperitoneum: Secondary | ICD-10-CM | POA: Diagnosis not present

## 2018-01-24 DIAGNOSIS — C251 Malignant neoplasm of body of pancreas: Secondary | ICD-10-CM | POA: Diagnosis not present

## 2018-01-24 DIAGNOSIS — I700 Atherosclerosis of aorta: Secondary | ICD-10-CM | POA: Diagnosis not present

## 2018-01-25 DIAGNOSIS — C251 Malignant neoplasm of body of pancreas: Secondary | ICD-10-CM | POA: Diagnosis not present

## 2018-01-25 DIAGNOSIS — K828 Other specified diseases of gallbladder: Secondary | ICD-10-CM | POA: Diagnosis not present

## 2018-02-05 ENCOUNTER — Telehealth: Payer: Self-pay | Admitting: Gastroenterology

## 2018-02-05 NOTE — Telephone Encounter (Signed)
It will put the case at 1:15 pm is that ok?

## 2018-02-05 NOTE — Telephone Encounter (Signed)
Records are on your desk for review.

## 2018-02-05 NOTE — Telephone Encounter (Signed)
Can you have his recent labs sent, most recent OV note from Dr. Bobby Rumpf and current med list.  thanks

## 2018-02-05 NOTE — Telephone Encounter (Signed)
I spoke with Tammy and she will fax the records for review

## 2018-02-05 NOTE — Telephone Encounter (Signed)
Yes, that's ok.  Thanks for checking.

## 2018-02-05 NOTE — Telephone Encounter (Signed)
Increasing bili, dilated gallbladder.  Dr Bobby Rumpf wants to know if the pt should be scheduled for stent replacement.  Please advise.

## 2018-02-05 NOTE — Telephone Encounter (Signed)
Liver tests January 08, 2018 show total bilirubin 0.9.  Liver tests January 22, 2018 show total bilirubin 4.2 alk phos 554 AST 85 ALT 80.  CT scan January 24, 2018 report reads "absence of previously visualized pneumobilia.  Mild central intrahepatic biliary ductal dilation, mildly increased.  Proximal CBD dilation is mildly increased.  Increased caliber of the gallbladder.  CBD stent is in stable position."   It does seem like he has stent occlusion based on imaging and labs.  Can you see about ERCP this Thursday, December 5 to follow my other cases at [facility]?  I would like him to have a set of LFTs upon arrival to endoscopy as well.  thanks

## 2018-02-07 ENCOUNTER — Other Ambulatory Visit: Payer: Self-pay

## 2018-02-07 DIAGNOSIS — T85590A Other mechanical complication of bile duct prosthesis, initial encounter: Secondary | ICD-10-CM

## 2018-02-07 DIAGNOSIS — K8689 Other specified diseases of pancreas: Secondary | ICD-10-CM

## 2018-02-07 NOTE — Telephone Encounter (Signed)
The pt has been scheduled for 12/6 at 12:30 pm.  He has been instructed and meds reviewed.  All questions answered.

## 2018-02-08 ENCOUNTER — Telehealth: Payer: Self-pay | Admitting: Gastroenterology

## 2018-02-08 ENCOUNTER — Encounter (HOSPITAL_COMMUNITY): Payer: Self-pay | Admitting: *Deleted

## 2018-02-08 NOTE — Telephone Encounter (Signed)
I returned call to the pt and we discussed what an ERCP is and that a stent may be placed to open the obstruction.  All questions were answered and the pt was encouraged to call back with any further concerns or questions. The pt has been advised of the information and verbalized understanding.

## 2018-02-09 ENCOUNTER — Ambulatory Visit (HOSPITAL_COMMUNITY): Payer: Medicare Other

## 2018-02-09 ENCOUNTER — Ambulatory Visit (HOSPITAL_COMMUNITY)
Admission: RE | Admit: 2018-02-09 | Discharge: 2018-02-09 | Disposition: A | Discharge status: Home / Self Care | Payer: Medicare Other | Source: Ambulatory Visit | Attending: Gastroenterology | Admitting: Gastroenterology

## 2018-02-09 ENCOUNTER — Encounter (HOSPITAL_COMMUNITY)
Admission: RE | Disposition: A | Discharge status: Home / Self Care | Payer: Self-pay | Source: Ambulatory Visit | Attending: Gastroenterology

## 2018-02-09 ENCOUNTER — Ambulatory Visit (HOSPITAL_COMMUNITY): Payer: Medicare Other | Admitting: Certified Registered Nurse Anesthetist

## 2018-02-09 ENCOUNTER — Other Ambulatory Visit: Payer: Self-pay

## 2018-02-09 ENCOUNTER — Encounter (HOSPITAL_COMMUNITY): Payer: Self-pay | Admitting: *Deleted

## 2018-02-09 DIAGNOSIS — Z7984 Long term (current) use of oral hypoglycemic drugs: Secondary | ICD-10-CM | POA: Diagnosis not present

## 2018-02-09 DIAGNOSIS — E119 Type 2 diabetes mellitus without complications: Secondary | ICD-10-CM | POA: Diagnosis not present

## 2018-02-09 DIAGNOSIS — T85590A Other mechanical complication of bile duct prosthesis, initial encounter: Secondary | ICD-10-CM | POA: Diagnosis not present

## 2018-02-09 DIAGNOSIS — K831 Obstruction of bile duct: Secondary | ICD-10-CM | POA: Insufficient documentation

## 2018-02-09 DIAGNOSIS — K8689 Other specified diseases of pancreas: Secondary | ICD-10-CM

## 2018-02-09 DIAGNOSIS — K219 Gastro-esophageal reflux disease without esophagitis: Secondary | ICD-10-CM | POA: Diagnosis not present

## 2018-02-09 DIAGNOSIS — Z79899 Other long term (current) drug therapy: Secondary | ICD-10-CM | POA: Diagnosis not present

## 2018-02-09 DIAGNOSIS — C259 Malignant neoplasm of pancreas, unspecified: Secondary | ICD-10-CM | POA: Diagnosis not present

## 2018-02-09 DIAGNOSIS — T859XXA Unspecified complication of internal prosthetic device, implant and graft, initial encounter: Secondary | ICD-10-CM

## 2018-02-09 HISTORY — PX: BILIARY STENT PLACEMENT: SHX5538

## 2018-02-09 HISTORY — PX: ENDOSCOPIC RETROGRADE CHOLANGIOPANCREATOGRAPHY (ERCP) WITH PROPOFOL: SHX5810

## 2018-02-09 LAB — GLUCOSE, CAPILLARY: Glucose-Capillary: 86 mg/dL (ref 70–99)

## 2018-02-09 LAB — PROTIME-INR
INR: 1.49
Prothrombin Time: 17.8 seconds — ABNORMAL HIGH (ref 11.4–15.2)

## 2018-02-09 LAB — COMPREHENSIVE METABOLIC PANEL
ALT: 68 U/L — ABNORMAL HIGH (ref 0–44)
AST: 116 U/L — ABNORMAL HIGH (ref 15–41)
Albumin: 2.2 g/dL — ABNORMAL LOW (ref 3.5–5.0)
Alkaline Phosphatase: 551 U/L — ABNORMAL HIGH (ref 38–126)
Anion gap: 7 (ref 5–15)
BUN: 8 mg/dL (ref 8–23)
CHLORIDE: 101 mmol/L (ref 98–111)
CO2: 30 mmol/L (ref 22–32)
Calcium: 8.9 mg/dL (ref 8.9–10.3)
Creatinine, Ser: 0.37 mg/dL — ABNORMAL LOW (ref 0.61–1.24)
GFR calc Af Amer: >60 mL/min (ref >60)
GFR calc non Af Amer: >60 mL/min (ref >60)
Glucose, Bld: 106 mg/dL — ABNORMAL HIGH (ref 70–99)
POTASSIUM: 3.5 mmol/L (ref 3.5–5.1)
Sodium: 138 mmol/L (ref 135–145)
Total Bilirubin: 11.3 mg/dL — ABNORMAL HIGH (ref 0.3–1.2)
Total Protein: 6.4 g/dL — ABNORMAL LOW (ref 6.5–8.1)

## 2018-02-09 LAB — CBC
HCT: 23.7 % — ABNORMAL LOW (ref 39.0–52.0)
HEMOGLOBIN: 7.7 g/dL — AB (ref 13.0–17.0)
MCH: 31.4 pg (ref 26.0–34.0)
MCHC: 32.5 g/dL (ref 30.0–36.0)
MCV: 96.7 fL (ref 80.0–100.0)
Platelets: 262 10*3/uL (ref 150–400)
RBC: 2.45 MIL/uL — ABNORMAL LOW (ref 4.22–5.81)
RDW: 19.1 % — ABNORMAL HIGH (ref 11.5–15.5)
WBC: 8.8 10*3/uL (ref 4.0–10.5)
nRBC: 0 % (ref 0.0–0.2)

## 2018-02-09 SURGERY — ENDOSCOPIC RETROGRADE CHOLANGIOPANCREATOGRAPHY (ERCP) WITH PROPOFOL
Anesthesia: General

## 2018-02-09 MED ORDER — GLUCAGON HCL RDNA (DIAGNOSTIC) 1 MG IJ SOLR
INTRAMUSCULAR | Status: AC
  Filled 2018-02-09: qty 1

## 2018-02-09 MED ORDER — FENTANYL CITRATE (PF) 100 MCG/2ML IJ SOLN
INTRAMUSCULAR | Status: DC | PRN
  Administered 2018-02-09: 100 ug via INTRAVENOUS

## 2018-02-09 MED ORDER — SUGAMMADEX SODIUM 200 MG/2ML IV SOLN
INTRAVENOUS | Status: DC | PRN
  Administered 2018-02-09: 150 mg via INTRAVENOUS

## 2018-02-09 MED ORDER — PROPOFOL 10 MG/ML IV BOLUS
INTRAVENOUS | Status: DC | PRN
  Administered 2018-02-09: 110 mg via INTRAVENOUS
  Administered 2018-02-09: 20 mg via INTRAVENOUS
  Administered 2018-02-09: 30 mg via INTRAVENOUS

## 2018-02-09 MED ORDER — CIPROFLOXACIN IN D5W 400 MG/200ML IV SOLN
INTRAVENOUS | Status: AC
  Filled 2018-02-09: qty 200

## 2018-02-09 MED ORDER — DEXAMETHASONE SODIUM PHOSPHATE 10 MG/ML IJ SOLN
INTRAMUSCULAR | Status: DC | PRN
  Administered 2018-02-09: 5 mg via INTRAVENOUS

## 2018-02-09 MED ORDER — PROPOFOL 10 MG/ML IV BOLUS
INTRAVENOUS | Status: AC
  Filled 2018-02-09: qty 20

## 2018-02-09 MED ORDER — LIDOCAINE 2% (20 MG/ML) 5 ML SYRINGE
INTRAMUSCULAR | Status: DC | PRN
  Administered 2018-02-09: 20 mg via INTRAVENOUS
  Administered 2018-02-09: 40 mg via INTRAVENOUS

## 2018-02-09 MED ORDER — SODIUM CHLORIDE 0.9 % IV SOLN: INTRAVENOUS | Status: DC

## 2018-02-09 MED ORDER — ONDANSETRON HCL 4 MG/2ML IJ SOLN
INTRAMUSCULAR | Status: DC | PRN
  Administered 2018-02-09: 4 mg via INTRAVENOUS

## 2018-02-09 MED ORDER — ROCURONIUM BROMIDE 50 MG/5ML IV SOSY
PREFILLED_SYRINGE | INTRAVENOUS | Status: DC | PRN
  Administered 2018-02-09: 40 mg via INTRAVENOUS

## 2018-02-09 MED ORDER — CIPROFLOXACIN IN D5W 400 MG/200ML IV SOLN
INTRAVENOUS | Status: DC | PRN
  Administered 2018-02-09: 400 mg via INTRAVENOUS

## 2018-02-09 MED ORDER — FENTANYL CITRATE (PF) 100 MCG/2ML IJ SOLN
INTRAMUSCULAR | Status: AC
  Filled 2018-02-09: qty 2

## 2018-02-09 MED ORDER — LACTATED RINGERS IV SOLN
INTRAVENOUS | Status: DC | PRN
  Administered 2018-02-09: 12:00:00 via INTRAVENOUS

## 2018-02-09 MED ORDER — PROMETHAZINE HCL 25 MG/ML IJ SOLN: 6.2500 mg | INTRAMUSCULAR | Status: DC | PRN

## 2018-02-09 MED ORDER — IOPAMIDOL (ISOVUE-M 300) INJECTION 61%
INTRAMUSCULAR | Status: DC | PRN
  Administered 2018-02-09: 25 mL via INTRATHECAL

## 2018-02-09 MED ORDER — INDOMETHACIN 50 MG RE SUPP
RECTAL | Status: AC
  Filled 2018-02-09: qty 2

## 2018-02-09 MED ORDER — FENTANYL CITRATE (PF) 100 MCG/2ML IJ SOLN: 25.0000 ug | INTRAMUSCULAR | Status: DC | PRN

## 2018-02-09 MED ORDER — MIDAZOLAM HCL 5 MG/5ML IJ SOLN
INTRAMUSCULAR | Status: DC | PRN
  Administered 2018-02-09: 2 mg via INTRAVENOUS

## 2018-02-09 MED ORDER — MIDAZOLAM HCL 2 MG/2ML IJ SOLN
INTRAMUSCULAR | Status: AC
  Filled 2018-02-09: qty 2

## 2018-02-09 MED ORDER — PHENYLEPHRINE 40 MCG/ML (10ML) SYRINGE FOR IV PUSH (FOR BLOOD PRESSURE SUPPORT)
PREFILLED_SYRINGE | INTRAVENOUS | Status: DC | PRN
  Administered 2018-02-09: 80 ug via INTRAVENOUS

## 2018-02-09 NOTE — Op Note (Signed)
[facility] Patient Name: Tommy Oconnor Procedure Date: 02/09/2018 MRN: [id number] Attending MD: Milus Banister , MD Date of Birth: 06-07-1950 CSN: [id number] Age: 67 Admit Type: Outpatient Procedure:                ERCP Indications:              Malignant stricture of the common bile duct (Tbili                            this morning was 11.3, alk phos was 551) Providers:                Milus Banister, MD, Burtis Junes, RN, Tinnie Gens,                            Technician, Adair Laundry, CRNA Referring MD:             Lavera Guise, MD Medicines:                General Anesthesia, Cipro 646 mg IV Complications:            No immediate complications. Estimated blood loss:                            None Estimated Blood Loss:     Estimated blood loss: none. Procedure:                Pre-Anesthesia Assessment:                           - Prior to the procedure, a History and Physical                            was performed, and patient medications and                            allergies were reviewed. The patient's tolerance of                            previous anesthesia was also reviewed. The risks                            and benefits of the procedure and the sedation                            options and risks were discussed with the patient.                            All questions were answered, and informed consent                            was obtained. Prior Anticoagulants: The patient has                            taken no previous anticoagulant or antiplatelet  agents. ASA Grade Assessment: II - A patient with                            mild systemic disease. After reviewing the risks                            and benefits, the patient was deemed in                            satisfactory condition to undergo the procedure.                           After obtaining informed consent, the scope was   passed under direct vision. Throughout the                            procedure, the patient's blood pressure, pulse, and                            oxygen saturations were monitored continuously. The                            [id number]-[id number] ([id number]) Olympus ERCP was introduced                            through the mouth, and used to inject contrast into                            and used to inject contrast into the bile duct. The                            ERCP was accomplished without difficulty. The                            patient tolerated the procedure well. Scope In: Scope Out: Findings:      The duodenoscope was advanced to the region of the major papilla without       detailed examination of the the UGI tract. The previously placed       uncovered metal stent was noted extending across the major papilla.       There was extensive biodebris and tissue ingrowth evident within the       distal end of the stent. A 44Autotome over a .035 hydrajag wire was used       to cannulate the existing stent/bile duct and contrast was injected.       Cholangiogram revealed diffuse occlusion of the existing stent, showed       that the proximal end of the existing stent lays in the CHD and that the       biliary tree proximal to the stent was slightly dilated but contained no       evident strictures. I used a 9-53m biliary retrieval balloon to clear       as much biodebris from within the existing stent as possible. During       balloon sweeps it was clear that there was significant tissue ingrowth  thoughout the length of the stent and so I placed a 7cm long, 10Fr       plastic biliary stent within the existing metal stent in very good       position. The proximal end of the plastic stent was 1-2cm above the       proximal end of the existing metal stent and the distal end of the       plastic stent extended 5-78m beyond the distal end of the existing       metal stent. There was good  flow of contrast and old bile from the       plastic stent after it's placement. Impression:               - The previously placed uncovered metal stent was                            occlused with significant tissue ingrowth and some                            biodebris. After clearing the biodebris from the                            existing metal stent, I placed a 7cm long 10Fr                            plastic stent within it to allow better biliary                            drainage. Moderate Sedation:      Not Applicable - Patient had care per Anesthesia. Recommendation:           - Discharge patient to home (ambulatory).                           - Follow LFTs.                           - Resume palliative chemotherapy per Dr. LBobby Rumpf Procedure Code(s):        --- Professional ---                           [id number] Endoscopic retrograde                            cholangiopancreatography (ERCP); with placement of                            endoscopic stent into biliary or pancreatic duct,                            including pre- and post-dilation and guide wire                            passage, when performed, including sphincterotomy,  when performed, each stent Diagnosis Code(s):        --- Professional ---                           K83.1, Obstruction of bile duct CPT copyright 2018 American [employer] Association. All rights reserved. The codes documented in this report are preliminary and upon coder review may  be revised to meet current compliance requirements. Milus Banister, MD 02/09/2018 1:29:59 PM This report has been signed electronically. Number of Addenda: 0

## 2018-02-09 NOTE — Transfer of Care (Signed)
Immediate Anesthesia Transfer of Care Note  Patient: Tommy Oconnor  Procedure(s) Performed: ENDOSCOPIC RETROGRADE CHOLANGIOPANCREATOGRAPHY (ERCP) WITH PROPOFOL (N/A ) BILIARY STENT PLACEMENT (N/A )  Patient Location: Endoscopy Unit  Anesthesia Type:General  Level of Consciousness: drowsy and patient cooperative  Airway & Oxygen Therapy: Patient Spontanous Breathing and Patient connected to face mask oxygen  Post-op Assessment: Report given to RN and Post -op Vital signs reviewed and stable  Post vital signs: Reviewed and stable  Last Vitals:  Vitals Value Taken Time  BP 129/78 02/09/2018  1:30 PM  Temp 36.5 C 02/09/2018  1:30 PM  Pulse 100 02/09/2018  1:32 PM  Resp 15 02/09/2018  1:32 PM  SpO2 100 % 02/09/2018  1:32 PM  Vitals shown include unvalidated device data.  Last Pain:  Vitals:   02/09/18 1330  TempSrc: Oral  PainSc: 0-No pain         Complications: No apparent anesthesia complications

## 2018-02-09 NOTE — Discharge Instructions (Signed)
YOU HAD AN ENDOSCOPIC PROCEDURE TODAY: Refer to the procedure report and other information in the discharge instructions given to you for any specific questions about what was found during the examination. If this information does not answer your questions, please call [place] office at [phone] to clarify.  ° °YOU SHOULD EXPECT: Some feelings of bloating in the abdomen. Passage of more gas than usual. Walking can help get rid of the air that was put into your GI tract during the procedure and reduce the bloating. If you had a lower endoscopy (such as a colonoscopy or flexible sigmoidoscopy) you may notice spotting of blood in your stool or on the toilet paper. Some abdominal soreness may be present for a day or two, also. ° °DIET: Your first meal following the procedure should be a light meal and then it is ok to progress to your normal diet. A half-sandwich or bowl of soup is an example of a good first meal. Heavy or fried foods are harder to digest and may make you feel nauseous or bloated. Drink plenty of fluids but you should avoid alcoholic beverages for 24 hours. If you had a esophageal dilation, please see attached instructions for diet.   ° °ACTIVITY: Your care partner should take you home directly after the procedure. You should plan to take it easy, moving slowly for the rest of the day. You can resume normal activity the day after the procedure however YOU SHOULD NOT DRIVE, use power tools, machinery or perform tasks that involve climbing or major physical exertion for 24 hours (because of the sedation medicines used during the test).  ° °SYMPTOMS TO REPORT IMMEDIATELY: °A gastroenterologist can be reached at any hour. Please call [phone]  for any of the following symptoms:  °Following lower endoscopy (colonoscopy, flexible sigmoidoscopy) °Excessive amounts of blood in the stool  °Significant tenderness, worsening of abdominal pains  °Swelling of the abdomen that is new, acute  °Fever of 100° or  higher  °Following upper endoscopy (EGD, EUS, ERCP, esophageal dilation) °Vomiting of blood or coffee ground material  °New, significant abdominal pain  °New, significant chest pain or pain under the shoulder blades  °Painful or persistently difficult swallowing  °New shortness of breath  °Black, tarry-looking or red, bloody stools ° °FOLLOW UP:  °If any biopsies were taken you will be contacted by phone or by letter within the next 1-3 weeks. Call [phone]  if you have not heard about the biopsies in 3 weeks.  °Please also call with any specific questions about appointments or follow up tests. ° °

## 2018-02-09 NOTE — Anesthesia Postprocedure Evaluation (Signed)
Anesthesia Post Note  Patient: Jamaris Theard  Procedure(s) Performed: ENDOSCOPIC RETROGRADE CHOLANGIOPANCREATOGRAPHY (ERCP) WITH PROPOFOL (N/A ) BILIARY STENT PLACEMENT (N/A )     Patient location during evaluation: PACU Anesthesia Type: General Level of consciousness: awake and alert Pain management: pain level controlled Vital Signs Assessment: post-procedure vital signs reviewed and stable Respiratory status: spontaneous breathing, nonlabored ventilation and respiratory function stable Cardiovascular status: blood pressure returned to baseline and stable Postop Assessment: no apparent nausea or vomiting Anesthetic complications: no    Last Vitals:  Vitals:   02/09/18 1340 02/09/18 1350  BP: 132/77 125/88  Pulse: 97 95  Resp: 14 14  Temp:    SpO2: 96% 94%    Last Pain:  Vitals:   02/09/18 1350  TempSrc:   PainSc: 0-No pain                 Brennan Bailey

## 2018-02-09 NOTE — Anesthesia Procedure Notes (Signed)
Procedure Name: Intubation Date/Time: 02/09/2018 12:37 PM Performed by: Montel Clock, CRNA Pre-anesthesia Checklist: Patient identified, Emergency Drugs available, Suction available, Patient being monitored and Timeout performed Patient Re-evaluated:Patient Re-evaluated prior to induction Oxygen Delivery Method: Circle system utilized Preoxygenation: Pre-oxygenation with 100% oxygen Induction Type: IV induction Ventilation: Mask ventilation without difficulty and Oral airway inserted - appropriate to patient size Laryngoscope Size: Mac and 3 Grade View: Grade I Tube type: Oral Tube size: 7.5 mm Number of attempts: 1 Airway Equipment and Method: Stylet Placement Confirmation: ETT inserted through vocal cords under direct vision,  positive ETCO2 and breath sounds checked- equal and bilateral Secured at: 23 cm Tube secured with: Tape Dental Injury: Teeth and Oropharynx as per pre-operative assessment

## 2018-02-09 NOTE — Anesthesia Preprocedure Evaluation (Addendum)
Anesthesia Evaluation  Patient identified by MRN, date of birth, ID band Patient awake    Reviewed: Allergy & Precautions, NPO status , Patient's Chart, lab work & pertinent test results  History of Anesthesia Complications Negative for: history of anesthetic complications  Airway Mallampati: II       Dental  (+) Edentulous Upper, Edentulous Lower, Dental Advisory Given   Pulmonary neg pulmonary ROS,    breath sounds clear to auscultation       Cardiovascular negative cardio ROS   Rhythm:Regular Rate:Normal     Neuro/Psych negative neurological ROS     GI/Hepatic Neg liver ROS, GERD  Medicated and Controlled,Pancreatic ca, on chronic methadone and fentanyl patches   Endo/Other  diabetes, Type 2, Oral Hypoglycemic Agents  Renal/GU negative Renal ROS     Musculoskeletal negative musculoskeletal ROS (+)   Abdominal   Peds  Hematology  (+) anemia ,   Anesthesia Other Findings Day of surgery medications reviewed with the patient.  Reproductive/Obstetrics                           Anesthesia Physical Anesthesia Plan  ASA: III  Anesthesia Plan: General   Post-op Pain Management:    Induction: Intravenous  PONV Risk Score and Plan: 2 and Treatment may vary due to age or medical condition, Ondansetron and Dexamethasone  Airway Management Planned: Oral ETT  Additional Equipment:   Intra-op Plan:   Post-operative Plan: Extubation in OR  Informed Consent: I have reviewed the patients History and Physical, chart, labs and discussed the procedure including the risks, benefits and alternatives for the proposed anesthesia with the patient or authorized representative who has indicated his/her understanding and acceptance.   Dental advisory given  Plan Discussed with: CRNA  Anesthesia Plan Comments:       Anesthesia Quick Evaluation

## 2018-02-09 NOTE — H&P (Signed)
HPI: This is a very pleasant 67 yo man  Chief complaint is pancreatic adenocarcinoma undergoing palliative chemotherapy.  He has currently an uncovered metal biliary stent placed 3 or 4 months ago.  Recent imaging suggests stent occlusion and he has become jaundiced.  ROS: complete GI ROS as described in HPI, all other review negative.  Constitutional:  No unintentional weight loss   Past Medical History:  Diagnosis Date  . Pancreatic cancer ([place])    "stage III; dx'd ~ 07/2017"    Past Surgical History:  Procedure Laterality Date  . APPENDECTOMY    . BILIARY STENT PLACEMENT  10/16/2017   Procedure: BILIARY STENT PLACEMENT;  Surgeon: Irving Copas., MD;  Location: [place];  Service: Gastroenterology;;  . ERCP N/A 10/16/2017   Procedure: ENDOSCOPIC RETROGRADE CHOLANGIOPANCREATOGRAPHY (ERCP);  Surgeon: Irving Copas., MD;  Location: [place];  Service: Gastroenterology;  Laterality: N/A;  . EUS N/A 08/03/2017   Procedure: UPPER ENDOSCOPIC ULTRASOUND (EUS) RADIAL;  Surgeon: Milus Banister, MD;  Location: [facility];  Service: Endoscopy;  Laterality: N/A;  . FINE NEEDLE ASPIRATION N/A 08/03/2017   Procedure: FINE NEEDLE ASPIRATION (FNA) LINEAR;  Surgeon: Milus Banister, MD;  Location: [facility];  Service: Endoscopy;  Laterality: N/A;  . SPHINCTEROTOMY  10/16/2017   Procedure: SPHINCTEROTOMY;  Surgeon: Mansouraty, Telford Nab., MD;  Location: [place];  Service: Gastroenterology;;    Current Facility-Administered Medications  Medication Dose Route Frequency Provider Last Rate Last Dose  . 0.9 %  sodium chloride infusion   Intravenous Continuous Milus Banister, MD      . 0.9 %  sodium chloride infusion   Intravenous Continuous Milus Banister, MD      . fentaNYL (SUBLIMAZE) injection 25-50 mcg  25-50 mcg Intravenous Q5 min PRN Brennan Bailey, MD      . promethazine (PHENERGAN) injection 6.25-12.5 mg  6.25-12.5 mg Intravenous Q15 min PRN Brennan Bailey, MD       Facility-Administered Medications Ordered in Other Encounters  Medication Dose Route Frequency Provider Last Rate Last Dose  . lactated ringers infusion    Continuous PRN Montel Clock, CRNA        Allergies as of 02/07/2018  . (No Known Allergies)    Family History  Problem Relation Age of Onset  . Diabetes Brother     Social History   Socioeconomic History  . Marital status: Divorced    Spouse name: Not on file  . Number of children: Not on file  . Years of education: Not on file  . Highest education level: Not on file  Occupational History  . Not on file  Social Needs  . Financial resource strain: Not on file  . Food insecurity:    Worry: Not on file    Inability: Not on file  . Transportation needs:    Medical: Not on file    Non-medical: Not on file  Tobacco Use  . Smoking status: Never Smoker  . Smokeless tobacco: Never Used  Substance and Sexual Activity  . Alcohol use: Never    Frequency: Never  . Drug use: Not Currently    Comment: 10/13/2017 "into drugs as a teenager; for ~ 1 yr"  . Sexual activity: Not Currently  Lifestyle  . Physical activity:    Days per week: Not on file    Minutes per session: Not on file  . Stress: Not on file  Relationships  . Social connections:    Talks on phone:  Not on file    Gets together: Not on file    Attends religious service: Not on file    Active member of club or organization: Not on file    Attends meetings of clubs or organizations: Not on file    Relationship status: Not on file  . Intimate partner violence:    Fear of current or ex partner: Not on file    Emotionally abused: Not on file    Physically abused: Not on file    Forced sexual activity: Not on file  Other Topics Concern  . Not on file  Social History Narrative  . Not on file     Physical Exam: BP (!) 114/59   Pulse 93   Temp 98.1 F (36.7 C) (Oral) Comment: error in chartin g  Resp 12   Ht 5\' 6"  (1.676 m)   Wt  63.5 kg   SpO2 97%   BMI 22.60 kg/m  Constitutional: generally well-appearing Psychiatric: alert and oriented x3 Abdomen: soft, nontender, nondistended, no obvious ascites, no peritoneal signs, normal bowel sounds No peripheral edema noted in lower extremities  Assessment and plan: 67 y.o. male with pancreatic adenocarcinoma, likely occluded biliary stent, jaundice  For ERCP today, possible stent in stent treatment.  Please see the "Patient Instructions" section for addition details about the plan.  Owens Loffler, MD [facility] 02/09/2018, 12:04 PM

## 2018-02-12 ENCOUNTER — Encounter (HOSPITAL_COMMUNITY): Payer: Self-pay | Admitting: Gastroenterology

## 2018-02-13 DIAGNOSIS — R600 Localized edema: Secondary | ICD-10-CM | POA: Diagnosis not present

## 2018-02-13 DIAGNOSIS — M79662 Pain in left lower leg: Secondary | ICD-10-CM | POA: Diagnosis not present

## 2018-02-13 DIAGNOSIS — C251 Malignant neoplasm of body of pancreas: Secondary | ICD-10-CM | POA: Diagnosis not present

## 2018-02-13 DIAGNOSIS — R509 Fever, unspecified: Secondary | ICD-10-CM | POA: Diagnosis not present

## 2018-02-13 DIAGNOSIS — M79661 Pain in right lower leg: Secondary | ICD-10-CM | POA: Diagnosis not present

## 2018-02-13 DIAGNOSIS — D649 Anemia, unspecified: Secondary | ICD-10-CM | POA: Diagnosis not present

## 2018-02-13 DIAGNOSIS — Z0001 Encounter for general adult medical examination with abnormal findings: Secondary | ICD-10-CM | POA: Diagnosis not present

## 2018-02-14 DIAGNOSIS — C251 Malignant neoplasm of body of pancreas: Secondary | ICD-10-CM | POA: Diagnosis not present

## 2018-02-14 DIAGNOSIS — Z0001 Encounter for general adult medical examination with abnormal findings: Secondary | ICD-10-CM | POA: Diagnosis not present

## 2018-02-14 DIAGNOSIS — R600 Localized edema: Secondary | ICD-10-CM | POA: Diagnosis not present

## 2018-02-14 DIAGNOSIS — M79661 Pain in right lower leg: Secondary | ICD-10-CM | POA: Diagnosis not present

## 2018-02-14 DIAGNOSIS — R509 Fever, unspecified: Secondary | ICD-10-CM | POA: Diagnosis not present

## 2018-02-14 DIAGNOSIS — D649 Anemia, unspecified: Secondary | ICD-10-CM | POA: Diagnosis not present

## 2018-02-16 DIAGNOSIS — Z5111 Encounter for antineoplastic chemotherapy: Secondary | ICD-10-CM | POA: Diagnosis not present

## 2018-02-16 DIAGNOSIS — C251 Malignant neoplasm of body of pancreas: Secondary | ICD-10-CM | POA: Diagnosis not present

## 2018-02-23 DIAGNOSIS — C251 Malignant neoplasm of body of pancreas: Secondary | ICD-10-CM | POA: Diagnosis not present

## 2018-02-23 DIAGNOSIS — R945 Abnormal results of liver function studies: Secondary | ICD-10-CM | POA: Diagnosis not present

## 2018-03-27 DIAGNOSIS — C251 Malignant neoplasm of body of pancreas: Secondary | ICD-10-CM | POA: Diagnosis not present

## 2018-03-27 DIAGNOSIS — C259 Malignant neoplasm of pancreas, unspecified: Secondary | ICD-10-CM | POA: Diagnosis not present

## 2018-03-27 DIAGNOSIS — C801 Malignant (primary) neoplasm, unspecified: Secondary | ICD-10-CM | POA: Diagnosis not present

## 2018-03-30 DIAGNOSIS — C251 Malignant neoplasm of body of pancreas: Secondary | ICD-10-CM | POA: Diagnosis not present

## 2018-04-04 DIAGNOSIS — Z125 Encounter for screening for malignant neoplasm of prostate: Secondary | ICD-10-CM | POA: Diagnosis not present

## 2018-04-04 DIAGNOSIS — Z1331 Encounter for screening for depression: Secondary | ICD-10-CM | POA: Diagnosis not present

## 2018-04-04 DIAGNOSIS — Z0000 Encounter for general adult medical examination without abnormal findings: Secondary | ICD-10-CM | POA: Diagnosis not present

## 2018-04-04 DIAGNOSIS — Z9181 History of falling: Secondary | ICD-10-CM | POA: Diagnosis not present

## 2018-04-04 DIAGNOSIS — E785 Hyperlipidemia, unspecified: Secondary | ICD-10-CM | POA: Diagnosis not present

## 2018-04-16 DIAGNOSIS — C251 Malignant neoplasm of body of pancreas: Secondary | ICD-10-CM | POA: Diagnosis not present

## 2018-04-17 DIAGNOSIS — R1084 Generalized abdominal pain: Secondary | ICD-10-CM | POA: Diagnosis not present

## 2018-04-17 DIAGNOSIS — I700 Atherosclerosis of aorta: Secondary | ICD-10-CM | POA: Diagnosis not present

## 2018-04-17 DIAGNOSIS — C251 Malignant neoplasm of body of pancreas: Secondary | ICD-10-CM | POA: Diagnosis not present

## 2018-04-17 DIAGNOSIS — C259 Malignant neoplasm of pancreas, unspecified: Secondary | ICD-10-CM | POA: Diagnosis not present

## 2018-04-17 DIAGNOSIS — Z5111 Encounter for antineoplastic chemotherapy: Secondary | ICD-10-CM | POA: Diagnosis not present

## 2018-04-18 ENCOUNTER — Telehealth: Payer: Self-pay | Admitting: Gastroenterology

## 2018-04-18 NOTE — Telephone Encounter (Signed)
Dr. Luiz Ochoa' office called requesting that Dr. Ardis Hughs review pt's ct scan that he had done yesterday. Ct scan is in pacs. Dr. Elder Cyphers phone is [phone] option 8.

## 2018-04-18 NOTE — Telephone Encounter (Signed)
I have tried to reach Dr Jaclyn Shaggy office line rings busy

## 2018-04-18 NOTE — Telephone Encounter (Signed)
[phone] option 8 is the correct #

## 2018-04-18 NOTE — Telephone Encounter (Signed)
Dr Jacobs please review  

## 2018-04-18 NOTE — Telephone Encounter (Signed)
I reviewed the CT images and report.   I need recent LFT results (probably best to send the past 2-3 months worth of LFTs actually).

## 2018-04-18 NOTE — Telephone Encounter (Signed)
I spoke with Catrina at Dr Jaclyn Shaggy office and she will fax labs over for review.  Dr Ardis Hughs I will put them on your desk when they arrive

## 2018-04-20 DIAGNOSIS — Z79899 Other long term (current) drug therapy: Secondary | ICD-10-CM | POA: Diagnosis not present

## 2018-04-20 DIAGNOSIS — K219 Gastro-esophageal reflux disease without esophagitis: Secondary | ICD-10-CM | POA: Diagnosis not present

## 2018-04-20 DIAGNOSIS — E785 Hyperlipidemia, unspecified: Secondary | ICD-10-CM | POA: Diagnosis not present

## 2018-04-20 DIAGNOSIS — R739 Hyperglycemia, unspecified: Secondary | ICD-10-CM | POA: Diagnosis not present

## 2018-04-20 DIAGNOSIS — C259 Malignant neoplasm of pancreas, unspecified: Secondary | ICD-10-CM | POA: Diagnosis not present

## 2018-04-20 NOTE — Telephone Encounter (Signed)
Left message on machine to call back  

## 2018-04-20 NOTE — Telephone Encounter (Signed)
I reviewed an office note from Dr. Bobby Rumpf dated April 16, 2018.  There was some labs drawn that same day.  White blood cell count was 3.6 total bilirubin 1.2 alkaline phosphatase 665 AST 73, ALT 83.  No other lab tests were included.  I had reviewed his CT scan from last week there was a note by the radiologist and the impression regarding "absence of intrahepatic pneumobilia on today's scan with interval mildly increased caliber of the proximal CBD.  Findings could indicate early CBD stent occlusion/dysfunction.  CBD stents are in stable position."   Early December 2019 I cleaned out the previously placed uncovered metal biliary stent and placed a 10 French plastic stent through it to allow for good biliary drainage.  That morning his bilirubin was 11.   Hard to say if the stents are occluded.  Certainly no signs of cholangitis from his office note.  His bilirubin of 1.2 is also reassuring.   Please call their office and recommend that LFTs be done weekly and if there is a dramatic spike then they should call here and at that time he would need ERCP to probably remove the previous plastic stent, clean out the metal one and place a new plastic stent.

## 2018-04-23 NOTE — Telephone Encounter (Signed)
Catrina with Dr Jaclyn Shaggy office was advised and states the pt will be in this week and labs will be done at that time.  They will call if the labs spike.

## 2018-04-24 DIAGNOSIS — C251 Malignant neoplasm of body of pancreas: Secondary | ICD-10-CM | POA: Diagnosis not present

## 2018-04-30 ENCOUNTER — Telehealth: Payer: Self-pay | Admitting: Gastroenterology

## 2018-04-30 DIAGNOSIS — C251 Malignant neoplasm of body of pancreas: Secondary | ICD-10-CM | POA: Diagnosis not present

## 2018-04-30 NOTE — Telephone Encounter (Signed)
Dr. Bobby Rumpf from the [facility]. Called to inform that pt's liver enzymes are increasing so he is requesting pt to see Dr. Ardis Hughs asap.

## 2018-04-30 NOTE — Telephone Encounter (Signed)
I spoke with Dr. Bobby Rumpf.  Tommy Oconnor liver tests continue to rise.  His alk phos is 1100, his bilirubin is in the 1.5-2 range.  I suspect he has some low-grade obstruction related to the 10 [place] plastic stent that I placed through his uncovered metal biliary stent in early December 2019.  Patty, Please contact Mr. Caster.  I would like to do an ERCP for him this Thursday, February 27 to remove his previous plastic stent and replace it with a new one.  Ask him if he is having any fevers and if he has any subjective fevers at all or subjective chills at all then please call him in Cipro 500 mg pills 1 pill twice daily, 5-day total prescription.  Thank you

## 2018-05-01 ENCOUNTER — Other Ambulatory Visit: Payer: Self-pay

## 2018-05-01 ENCOUNTER — Telehealth: Payer: Self-pay | Admitting: Gastroenterology

## 2018-05-01 DIAGNOSIS — R945 Abnormal results of liver function studies: Secondary | ICD-10-CM

## 2018-05-01 DIAGNOSIS — R7989 Other specified abnormal findings of blood chemistry: Secondary | ICD-10-CM

## 2018-05-01 DIAGNOSIS — K8689 Other specified diseases of pancreas: Secondary | ICD-10-CM

## 2018-05-01 DIAGNOSIS — T85590A Other mechanical complication of bile duct prosthesis, initial encounter: Secondary | ICD-10-CM

## 2018-05-01 MED ORDER — CIPROFLOXACIN HCL 500 MG PO TABS: 500.0000 mg | ORAL_TABLET | Freq: Two times a day (BID) | ORAL | 0 refills | Status: DC

## 2018-05-01 NOTE — Telephone Encounter (Signed)
See alternate note  

## 2018-05-01 NOTE — Telephone Encounter (Signed)
Appt scheduled for 05/10/18 at 830 am at [place] was also sent to the pharmacy.  Per verbal order the pt could not be done this week and needed an extra 5 days of Cipro 500 mg BID. 10 days total. No answer or voice mail on cell-   ERCP scheduled, pt instructed (answered home number) and medications reviewed.  Patient instructions mailed to home.  Patient to call with any questions or concerns.

## 2018-05-10 ENCOUNTER — Encounter (HOSPITAL_COMMUNITY): Payer: Self-pay | Admitting: Gastroenterology

## 2018-05-10 ENCOUNTER — Ambulatory Visit (HOSPITAL_COMMUNITY): Payer: Medicare Other

## 2018-05-10 ENCOUNTER — Ambulatory Visit (HOSPITAL_COMMUNITY)
Admission: RE | Admit: 2018-05-10 | Discharge: 2018-05-10 | Disposition: A | Discharge status: Home / Self Care | Payer: Medicare Other | Attending: Gastroenterology | Admitting: Gastroenterology

## 2018-05-10 ENCOUNTER — Other Ambulatory Visit: Payer: Self-pay

## 2018-05-10 ENCOUNTER — Ambulatory Visit (HOSPITAL_COMMUNITY): Payer: Medicare Other | Admitting: Anesthesiology

## 2018-05-10 ENCOUNTER — Encounter (HOSPITAL_COMMUNITY)
Admission: RE | Disposition: A | Discharge status: Home / Self Care | Payer: Self-pay | Source: Home / Self Care | Attending: Gastroenterology

## 2018-05-10 DIAGNOSIS — K8689 Other specified diseases of pancreas: Secondary | ICD-10-CM

## 2018-05-10 DIAGNOSIS — E119 Type 2 diabetes mellitus without complications: Secondary | ICD-10-CM | POA: Insufficient documentation

## 2018-05-10 DIAGNOSIS — K831 Obstruction of bile duct: Secondary | ICD-10-CM | POA: Diagnosis not present

## 2018-05-10 DIAGNOSIS — Z7984 Long term (current) use of oral hypoglycemic drugs: Secondary | ICD-10-CM | POA: Insufficient documentation

## 2018-05-10 DIAGNOSIS — Z79899 Other long term (current) drug therapy: Secondary | ICD-10-CM | POA: Diagnosis not present

## 2018-05-10 DIAGNOSIS — F1190 Opioid use, unspecified, uncomplicated: Secondary | ICD-10-CM | POA: Insufficient documentation

## 2018-05-10 DIAGNOSIS — D649 Anemia, unspecified: Secondary | ICD-10-CM | POA: Diagnosis not present

## 2018-05-10 DIAGNOSIS — K8681 Exocrine pancreatic insufficiency: Secondary | ICD-10-CM | POA: Insufficient documentation

## 2018-05-10 DIAGNOSIS — C259 Malignant neoplasm of pancreas, unspecified: Secondary | ICD-10-CM | POA: Diagnosis not present

## 2018-05-10 DIAGNOSIS — T85590A Other mechanical complication of bile duct prosthesis, initial encounter: Secondary | ICD-10-CM

## 2018-05-10 DIAGNOSIS — K219 Gastro-esophageal reflux disease without esophagitis: Secondary | ICD-10-CM | POA: Insufficient documentation

## 2018-05-10 DIAGNOSIS — R945 Abnormal results of liver function studies: Secondary | ICD-10-CM

## 2018-05-10 DIAGNOSIS — R7989 Other specified abnormal findings of blood chemistry: Secondary | ICD-10-CM

## 2018-05-10 DIAGNOSIS — Z4659 Encounter for fitting and adjustment of other gastrointestinal appliance and device: Secondary | ICD-10-CM | POA: Diagnosis not present

## 2018-05-10 DIAGNOSIS — Z4689 Encounter for fitting and adjustment of other specified devices: Secondary | ICD-10-CM

## 2018-05-10 HISTORY — PX: BILIARY STENT PLACEMENT: SHX5538

## 2018-05-10 HISTORY — PX: STENT REMOVAL: SHX6421

## 2018-05-10 HISTORY — PX: ENDOSCOPIC RETROGRADE CHOLANGIOPANCREATOGRAPHY (ERCP) WITH PROPOFOL: SHX5810

## 2018-05-10 LAB — COMPREHENSIVE METABOLIC PANEL
ALT: 82 U/L — ABNORMAL HIGH (ref 0–44)
AST: 76 U/L — ABNORMAL HIGH (ref 15–41)
Albumin: 3.1 g/dL — ABNORMAL LOW (ref 3.5–5.0)
Alkaline Phosphatase: 874 U/L — ABNORMAL HIGH (ref 38–126)
Anion gap: 6 (ref 5–15)
BUN: 16 mg/dL (ref 8–23)
CALCIUM: 8.7 mg/dL — AB (ref 8.9–10.3)
CO2: 30 mmol/L (ref 22–32)
Chloride: 101 mmol/L (ref 98–111)
Creatinine, Ser: 0.55 mg/dL — ABNORMAL LOW (ref 0.61–1.24)
GFR calc Af Amer: >60 mL/min (ref >60)
Glucose, Bld: 144 mg/dL — ABNORMAL HIGH (ref 70–99)
Potassium: 3.6 mmol/L (ref 3.5–5.1)
Sodium: 137 mmol/L (ref 135–145)
Total Bilirubin: 1.4 mg/dL — ABNORMAL HIGH (ref 0.3–1.2)
Total Protein: 6.7 g/dL (ref 6.5–8.1)

## 2018-05-10 LAB — GLUCOSE, CAPILLARY: GLUCOSE-CAPILLARY: 125 mg/dL — AB (ref 70–99)

## 2018-05-10 SURGERY — ENDOSCOPIC RETROGRADE CHOLANGIOPANCREATOGRAPHY (ERCP) WITH PROPOFOL
Anesthesia: General

## 2018-05-10 MED ORDER — INDOMETHACIN 50 MG RE SUPP
RECTAL | Status: AC
  Filled 2018-05-10: qty 2

## 2018-05-10 MED ORDER — CIPROFLOXACIN IN D5W 400 MG/200ML IV SOLN
INTRAVENOUS | Status: AC
  Filled 2018-05-10: qty 200

## 2018-05-10 MED ORDER — ONDANSETRON HCL 4 MG/2ML IJ SOLN
INTRAMUSCULAR | Status: DC | PRN
  Administered 2018-05-10: 4 mg via INTRAVENOUS

## 2018-05-10 MED ORDER — FENTANYL CITRATE (PF) 100 MCG/2ML IJ SOLN
INTRAMUSCULAR | Status: AC
  Filled 2018-05-10: qty 2

## 2018-05-10 MED ORDER — CIPROFLOXACIN IN D5W 400 MG/200ML IV SOLN
INTRAVENOUS | Status: DC | PRN
  Administered 2018-05-10: 400 mg via INTRAVENOUS

## 2018-05-10 MED ORDER — MIDAZOLAM HCL 2 MG/2ML IJ SOLN
INTRAMUSCULAR | Status: AC
  Filled 2018-05-10: qty 2

## 2018-05-10 MED ORDER — LACTATED RINGERS IV SOLN
INTRAVENOUS | Status: DC | PRN
  Administered 2018-05-10: 08:00:00 via INTRAVENOUS

## 2018-05-10 MED ORDER — SODIUM CHLORIDE 0.9 % IV SOLN
INTRAVENOUS | Status: DC | PRN
  Administered 2018-05-10: 30 mL

## 2018-05-10 MED ORDER — ROCURONIUM BROMIDE 10 MG/ML (PF) SYRINGE
PREFILLED_SYRINGE | INTRAVENOUS | Status: DC | PRN
  Administered 2018-05-10: 40 mg via INTRAVENOUS

## 2018-05-10 MED ORDER — PROPOFOL 10 MG/ML IV BOLUS
INTRAVENOUS | Status: DC | PRN
  Administered 2018-05-10: 120 mg via INTRAVENOUS

## 2018-05-10 MED ORDER — LIDOCAINE 2% (20 MG/ML) 5 ML SYRINGE
INTRAMUSCULAR | Status: DC | PRN
  Administered 2018-05-10: 80 mg via INTRAVENOUS

## 2018-05-10 MED ORDER — SUGAMMADEX SODIUM 200 MG/2ML IV SOLN
INTRAVENOUS | Status: DC | PRN
  Administered 2018-05-10: 150 mg via INTRAVENOUS

## 2018-05-10 MED ORDER — SODIUM CHLORIDE 0.9 % IV SOLN: INTRAVENOUS | Status: DC

## 2018-05-10 MED ORDER — MIDAZOLAM HCL 5 MG/5ML IJ SOLN
INTRAMUSCULAR | Status: DC | PRN
  Administered 2018-05-10: 2 mg via INTRAVENOUS

## 2018-05-10 MED ORDER — PROPOFOL 10 MG/ML IV BOLUS
INTRAVENOUS | Status: AC
  Filled 2018-05-10: qty 20

## 2018-05-10 MED ORDER — GLUCAGON HCL RDNA (DIAGNOSTIC) 1 MG IJ SOLR
INTRAMUSCULAR | Status: AC
  Filled 2018-05-10: qty 1

## 2018-05-10 MED ORDER — FENTANYL CITRATE (PF) 100 MCG/2ML IJ SOLN
INTRAMUSCULAR | Status: DC | PRN
  Administered 2018-05-10 (×2): 50 ug via INTRAVENOUS

## 2018-05-10 MED ORDER — DEXAMETHASONE SODIUM PHOSPHATE 10 MG/ML IJ SOLN
INTRAMUSCULAR | Status: DC | PRN
  Administered 2018-05-10: 10 mg via INTRAVENOUS

## 2018-05-10 MED ORDER — INDOMETHACIN 50 MG RE SUPP
RECTAL | Status: DC | PRN
  Administered 2018-05-10: 100 mg via RECTAL

## 2018-05-10 NOTE — Transfer of Care (Signed)
Immediate Anesthesia Transfer of Care Note  Patient: Tommy Oconnor  Procedure(s) Performed: ENDOSCOPIC RETROGRADE CHOLANGIOPANCREATOGRAPHY (ERCP) WITH PROPOFOL (N/A ) STENT REMOVAL BILIARY STENT PLACEMENT (N/A )  Patient Location: PACU  Anesthesia Type:General  Level of Consciousness: awake, alert  and oriented  Airway & Oxygen Therapy: Patient Spontanous Breathing and Patient connected to face mask oxygen  Post-op Assessment: Report given to RN and Post -op Vital signs reviewed and stable  Post vital signs: Reviewed and stable  Last Vitals:  Vitals Value Taken Time  BP    Temp    Pulse    Resp    SpO2      Last Pain:  Vitals:   05/10/18 0733  TempSrc: Oral  PainSc: 0-No pain         Complications: No apparent anesthesia complications

## 2018-05-10 NOTE — Anesthesia Procedure Notes (Signed)
Procedure Name: Intubation Date/Time: 05/10/2018 8:42 AM Performed by: Sudiksha Victor D, CRNA Pre-anesthesia Checklist: Patient identified, Emergency Drugs available, Suction available and Patient being monitored Patient Re-evaluated:Patient Re-evaluated prior to induction Oxygen Delivery Method: Circle system utilized Preoxygenation: Pre-oxygenation with 100% oxygen Induction Type: IV induction Ventilation: Mask ventilation without difficulty Laryngoscope Size: Mac and 3 Grade View: Grade I Tube type: Oral Tube size: 7.5 mm Number of attempts: 1 Airway Equipment and Method: Stylet Placement Confirmation: ETT inserted through vocal cords under direct vision,  positive ETCO2 and breath sounds checked- equal and bilateral Tube secured with: Tape Dental Injury: Teeth and Oropharynx as per pre-operative assessment

## 2018-05-10 NOTE — Discharge Instructions (Signed)
YOU HAD AN ENDOSCOPIC PROCEDURE TODAY: Refer to the procedure report and other information in the discharge instructions given to you for any specific questions about what was found during the examination. If this information does not answer your questions, please call [place] office at [phone] to clarify.  ° °YOU SHOULD EXPECT: Some feelings of bloating in the abdomen. Passage of more gas than usual. Walking can help get rid of the air that was put into your GI tract during the procedure and reduce the bloating.. ° °DIET: Your first meal following the procedure should be a light meal and then it is ok to progress to your normal diet. A half-sandwich or bowl of soup is an example of a good first meal. Heavy or fried foods are harder to digest and may make you feel nauseous or bloated. Drink plenty of fluids but you should avoid alcoholic beverages for 24 hours. If you had a esophageal dilation, please see attached instructions for diet.   ° °ACTIVITY: Your care partner should take you home directly after the procedure. You should plan to take it easy, moving slowly for the rest of the day. You can resume normal activity the day after the procedure however YOU SHOULD NOT DRIVE, use power tools, machinery or perform tasks that involve climbing or major physical exertion for 24 hours (because of the sedation medicines used during the test).  ° °SYMPTOMS TO REPORT IMMEDIATELY: °A gastroenterologist can be reached at any hour. Please call [phone]  for any of the following symptoms:  ° °Following upper endoscopy (EGD, EUS, ERCP, esophageal dilation) °Vomiting of blood or coffee ground material  °New, significant abdominal pain  °New, significant chest pain or pain under the shoulder blades  °Painful or persistently difficult swallowing  °New shortness of breath  °Black, tarry-looking or red, bloody stools ° °FOLLOW UP:  °If any biopsies were taken you will be contacted by phone or by letter within the next 1-3  weeks. Call [phone]  if you have not heard about the biopsies in 3 weeks.  °Please also call with any specific questions about appointments or follow up tests. ° °

## 2018-05-10 NOTE — Anesthesia Postprocedure Evaluation (Signed)
Anesthesia Post Note  Patient: Tommy Oconnor  Procedure(s) Performed: ENDOSCOPIC RETROGRADE CHOLANGIOPANCREATOGRAPHY (ERCP) WITH PROPOFOL (N/A ) STENT REMOVAL BILIARY STENT PLACEMENT (N/A )     Patient location during evaluation: PACU Anesthesia Type: General Level of consciousness: awake and alert and oriented Pain management: pain level controlled Vital Signs Assessment: post-procedure vital signs reviewed and stable Respiratory status: spontaneous breathing, nonlabored ventilation and respiratory function stable Cardiovascular status: blood pressure returned to baseline and stable Postop Assessment: no apparent nausea or vomiting Anesthetic complications: no    Last Vitals:  Vitals:   05/10/18 1000 05/10/18 1010  BP: (!) 141/85 135/85  Pulse: 86 82  Resp: 11 13  Temp:    SpO2: 99% 97%    Last Pain:  Vitals:   05/10/18 1010  TempSrc:   PainSc: 0-No pain                 Lakeria Starkman A.

## 2018-05-10 NOTE — Op Note (Signed)
[facility] Patient Name: Tommy Oconnor Procedure Date: 05/10/2018 MRN: [id number] Attending MD: Milus Banister , MD Date of Birth: [birth year] CSN: [id number] Age: 68 Admit Type: Outpatient Procedure:                ERCP Indications:              pancreatic cancer; ERCP 02/2018 a 7cm long 10 Fr                            plastic biliary stent was placed within a 6cm long                            uncovered SEMS (which was placed 2-3 months prior).                            Now with rising LFTs. Providers:                Milus Banister, MD, Cleda Daub, RN, Tinnie Gens, Technician, [facility], CRNA Referring MD:             Lavera Guise, MD Medicines:                General Anesthesia, Cipro 400 mg IV, Indomethacin                            326 mg PR Complications:            No immediate complications. Estimated blood loss:                            None Estimated Blood Loss:     Estimated blood loss was minimal. Procedure:                Pre-Anesthesia Assessment:                           - Prior to the procedure, a History and Physical                            was performed, and patient medications and                            allergies were reviewed. The patient's tolerance of                            previous anesthesia was also reviewed. The risks                            and benefits of the procedure and the sedation                            options and risks were discussed with the patient.  All questions were answered, and informed consent                            was obtained. Prior Anticoagulants: The patient has                            taken no previous anticoagulant or antiplatelet                            agents. ASA Grade Assessment: III - A patient with                            severe systemic disease. After reviewing the risks                            and  benefits, the patient was deemed in                            satisfactory condition to undergo the procedure.                           After obtaining informed consent, the scope was                            passed under direct vision. Throughout the                            procedure, the patient's blood pressure, pulse, and                            oxygen saturations were monitored continuously. The                            TJF-Q180V ([id number] Olympus duodenoscope was                            introduced through the mouth, and used to inject                            contrast into and used to inject contrast into the                            bile duct. The ERCP was accomplished without                            difficulty. The patient tolerated the procedure                            well. Scope In: Scope Out: Findings:      Scout film showed a plastic biliary stent residing withing a SEMS in the       RUQ. The duodenoscope was advanced to the region of the major papilla.       The previously placed plastic biliary stent was extending by 3-4cm  into       the duodenum measured from the distal end of the existing SEMS. I       removed the plastic stent with a snare. This precipitated brisk oozing       of blood from within the existing SEMS which resolved with observation,       flushing. A 44 Autotome over a .035 hydrawire was used to cannulate the       existing SEMS, biliary tree and contrast was injected. Cholangiogram       showed that the proximal edge of the existing SEMS is located in the       CHD. Apparent biliary trifurcation. The right and left main hepatic       ducts were mildly dilated. The existing SEMS was filled with biodebris       and was strictured from likely tumor ingrowth distally. The cystic duct       partially opacified, emanating somewhere within the existing SEMS. I       used a 9-59mm biliary retrieval balloon to clear the existing SEMS of        copious biodebris and a few small blood clots as well and then placed an       8cm long 47mm diameter within the existing SEMS. The distal end of the       new stent extended about 1cm from the distal end of the existing stent       in good position.      The main pancreatic duct was never cannulated or injected with dye. Impression:               - The previously placed plastic biliary stent had                            migrated distally, the existing uncovered 6cm long                            SEMS was filled with biodebris, tumor ingrowth.                           - The plastic stent was removed and the existing                            SEMS was cleared of copious biodebris.                           - A new 8cm long uncovered SEMS was placed within                            the existing SEMS in good position. Moderate Sedation:      Not Applicable - Patient had care per Anesthesia. Recommendation:           - Discharge patient to home (ambulatory).                           - Follow LFTS serially. Procedure Code(s):        --- Professional ---  [phone], Endoscopic retrograde                            cholangiopancreatography (ERCP); with removal and                            exchange of stent(s), biliary or pancreatic duct,                            including pre- and post-dilation and guide wire                            passage, when performed, including sphincterotomy,                            when performed, each stent exchanged Diagnosis Code(s):        --- Professional ---                           K83.1, Obstruction of bile duct CPT copyright 2018 American Medical Association. All rights reserved. The codes documented in this report are preliminary and upon coder review may  be revised to meet current compliance requirements. Milus Banister, MD 05/10/2018 9:50:14 AM This report has been signed electronically. Number of Addenda: 0

## 2018-05-10 NOTE — H&P (Signed)
HPI: This is a man with pancreatic cancer, likely obstructed biliary stent.  Early December 2019 I cleaned out the previously placed uncovered metal biliary stent and placed a 10 French plastic stent through it to allow for good biliary drainage.  That morning his bilirubin was 11.  Recently lfts rising.   Chief complaint is pancreatic cancer, obstructed biliary stent  ROS: complete GI ROS as described in HPI, all other review negative.  Constitutional:  No unintentional weight loss   Past Medical History:  Diagnosis Date  . Pancreatic cancer ([place])    "stage III; dx'd ~ 07/2017"    Past Surgical History:  Procedure Laterality Date  . APPENDECTOMY    . BILIARY STENT PLACEMENT  10/16/2017   Procedure: BILIARY STENT PLACEMENT;  Surgeon: Rush Landmark Telford Nab., MD;  Location: [facility];  Service: Gastroenterology;;  . BILIARY STENT PLACEMENT N/A 02/09/2018   Procedure: BILIARY STENT PLACEMENT;  Surgeon: Milus Banister, MD;  Location: [facility];  Service: Endoscopy;  Laterality: N/A;  . ENDOSCOPIC RETROGRADE CHOLANGIOPANCREATOGRAPHY (ERCP) WITH PROPOFOL N/A 02/09/2018   Procedure: ENDOSCOPIC RETROGRADE CHOLANGIOPANCREATOGRAPHY (ERCP) WITH PROPOFOL;  Surgeon: Milus Banister, MD;  Location: [facility];  Service: Endoscopy;  Laterality: N/A;  LFT on arrival  . ERCP N/A 10/16/2017   Procedure: ENDOSCOPIC RETROGRADE CHOLANGIOPANCREATOGRAPHY (ERCP);  Surgeon: Irving Copas., MD;  Location: [facility];  Service: Gastroenterology;  Laterality: N/A;  . EUS N/A 08/03/2017   Procedure: UPPER ENDOSCOPIC ULTRASOUND (EUS) RADIAL;  Surgeon: Milus Banister, MD;  Location: [facility];  Service: Endoscopy;  Laterality: N/A;  . FINE NEEDLE ASPIRATION N/A 08/03/2017   Procedure: FINE NEEDLE ASPIRATION (FNA) LINEAR;  Surgeon: Milus Banister, MD;  Location: [facility];  Service: Endoscopy;  Laterality: N/A;  . SPHINCTEROTOMY  10/16/2017   Procedure: SPHINCTEROTOMY;  Surgeon:  Mansouraty, Telford Nab., MD;  Location: [facility];  Service: Gastroenterology;;    Current Facility-Administered Medications  Medication Dose Route Frequency Provider Last Rate Last Dose  . 0.9 %  sodium chloride infusion   Intravenous Continuous Milus Banister, MD        Allergies as of 05/01/2018  . (No Known Allergies)    Family History  Problem Relation Age of Onset  . Diabetes Brother     Social History   Socioeconomic History  . Marital status: Divorced    Spouse name: Not on file  . Number of children: Not on file  . Years of education: Not on file  . Highest education level: Not on file  Occupational History  . Not on file  Social Needs  . Financial resource strain: Not on file  . Food insecurity:    Worry: Not on file    Inability: Not on file  . Transportation needs:    Medical: Not on file    Non-medical: Not on file  Tobacco Use  . Smoking status: Never Smoker  . Smokeless tobacco: Never Used  Substance and Sexual Activity  . Alcohol use: Never    Frequency: Never  . Drug use: Not Currently    Comment: 10/13/2017 "into drugs as a teenager; for ~ 1 yr"  . Sexual activity: Not Currently  Lifestyle  . Physical activity:    Days per week: Not on file    Minutes per session: Not on file  . Stress: Not on file  Relationships  . Social connections:    Talks on phone: Not on file    Gets together: Not on file  Attends religious service: Not on file    Active member of club or organization: Not on file    Attends meetings of clubs or organizations: Not on file    Relationship status: Not on file  . Intimate partner violence:    Fear of current or ex partner: Not on file    Emotionally abused: Not on file    Physically abused: Not on file    Forced sexual activity: Not on file  Other Topics Concern  . Not on file  Social History Narrative  . Not on file     Physical Exam: There were no vitals taken for this visit. Constitutional: generally  well-appearing Psychiatric: alert and oriented x3 Abdomen: soft, nontender, nondistended, no obvious ascites, no peritoneal signs, normal bowel sounds No peripheral edema noted in lower extremities  Assessment and plan: 68 y.o. male with pancreatic cancer  Likely obstructed biliary stent. For ERCP today  Please see the "Patient Instructions" section for addition details about the plan.  Owens Loffler, MD [facility] 05/10/2018, 7:20 AM

## 2018-05-10 NOTE — Anesthesia Preprocedure Evaluation (Addendum)
Anesthesia Evaluation  Patient identified by MRN, date of birth, ID band Patient awake    Reviewed: Allergy & Precautions, NPO status , Patient's Chart, lab work & pertinent test results  Airway Mallampati: II  TM Distance: >3 FB Neck ROM: Full    Dental  (+) Edentulous Upper, Edentulous Lower   Pulmonary neg pulmonary ROS,    Pulmonary exam normal breath sounds clear to auscultation       Cardiovascular negative cardio ROS Normal cardiovascular exam Rhythm:Regular Rate:Normal     Neuro/Psych negative neurological ROS  negative psych ROS   GI/Hepatic GERD  Medicated and Controlled,Elevated LFT's Pancreatic Ca- on Methadone and Fentanyl Biliary stent   Endo/Other  diabetes, Well Controlled, Type 2, Oral Hypoglycemic Agents  Renal/GU negative Renal ROS  negative genitourinary   Musculoskeletal negative musculoskeletal ROS (+)   Abdominal   Peds  Hematology  (+) anemia ,   Anesthesia Other Findings   Reproductive/Obstetrics                             Anesthesia Physical Anesthesia Plan  ASA: III  Anesthesia Plan: General   Post-op Pain Management:    Induction: Intravenous, Rapid sequence and Cricoid pressure planned  PONV Risk Score and Plan: 4 or greater and Ondansetron, Dexamethasone, Propofol infusion, Treatment may vary due to age or medical condition and Midazolam  Airway Management Planned: Oral ETT  Additional Equipment:   Intra-op Plan:   Post-operative Plan: Extubation in OR  Informed Consent: I have reviewed the patients History and Physical, chart, labs and discussed the procedure including the risks, benefits and alternatives for the proposed anesthesia with the patient or authorized representative who has indicated his/her understanding and acceptance.     Dental advisory given  Plan Discussed with: CRNA and Surgeon  Anesthesia Plan Comments:          Anesthesia Quick Evaluation

## 2018-05-11 ENCOUNTER — Encounter (HOSPITAL_COMMUNITY): Payer: Self-pay | Admitting: Gastroenterology

## 2018-05-17 DIAGNOSIS — C251 Malignant neoplasm of body of pancreas: Secondary | ICD-10-CM | POA: Diagnosis not present

## 2018-05-22 DIAGNOSIS — C251 Malignant neoplasm of body of pancreas: Secondary | ICD-10-CM

## 2018-06-22 DIAGNOSIS — C251 Malignant neoplasm of body of pancreas: Secondary | ICD-10-CM | POA: Diagnosis not present

## 2018-06-25 ENCOUNTER — Other Ambulatory Visit: Payer: Self-pay | Admitting: *Deleted

## 2018-06-25 ENCOUNTER — Encounter: Payer: Self-pay | Admitting: *Deleted

## 2018-06-25 NOTE — Patient Outreach (Signed)
[facility]) Care Management [place] Telephone Outreach- insurance screening referral 06/25/2018  Lon Klippel September 20, 1950 [id number]  Successful incoming telephone outreach from [place], 68 y/o male referred to [place] by insurance provider for high risk screening assessment.  Patient has history including, but not limited to, pancreatic mass/ cancer with biliary obstruction; DM; protein calorie malnutrition.  HIPAA/ identity verified with patient during phone call today.    Purpose of call and [facility] Care Management services were discussed with patient today.  Patient is agreeable to complete phone call with me but he states immediately that he is "self-sufficient" and that "things are currently going great" with his care.  Patient denies unmanageable pain and new/ recent falls.  Patient denies recent ED or unplanned hospital visits.  Patient reports that he is independent with ADL/ IADL's.  Patient further reports: -- has and is taking all medications as prescribed; denies concerns/ questions around his medications -- PCP: [facility]- reports last visit with PCP "last month," stating that he visits PCP as often as needed around his state of health; reports that he regularly sees his GI/ oncology provider as well.  -- denies community resource needs: states family/ friends provide him transportation and reports he is aware of various transportation resource options, but prefers his family and friends continue taking him to errands/ appointments -- reports cancer/ pain management/ diabetes "all under good control." -- drinks BOOST for nutritional supplementation- stated that he enjoys this and I encouraged him to inquire with company if they have coupons available for patient assistance and he stated that he would call [phone] number to inquire; reports eats "healthy," and denies need for assistance around obtaining food. -- confirms that he is following  recommended community precautions around COVID-19 for staying at home and social distancing; patient is able to verbalize accurate signs/ symptoms of possible COVID-19 exposure and appropriate action plan to contact his PCP should he develop signs/ symptoms.  Today, patient denies signs/ symptoms concerning for COVID-19.    Offered to have [place] CM contact him to further assess his care needs, and patient politely but firmly declined offer, stating "I am doing fine- I understand everything I am supposed to be doing, and I am doing it.... there is no need."  Explained to patient that I had placed [facility] CM patient letter in mail to him today, and encouraged him to review letter and to contact us if he reconsidered and would like for [place] to assist in his care needs; patient verbalizes agreement.  Oneta Rack, RN, BSN, [employer] [facility] Care Management  [phone]

## 2018-06-25 NOTE — Patient Outreach (Signed)
[facility]) Care Management [place]- insurance screening call Unsuccessful Outreach attempt # 1 06/25/2018  Tommy Oconnor May 04, 1950 [id number]  Unsuccessful telephone outreach attempt to Tommy Oconnor, 68 y/o male referred to [place] by insurance provider for high risk screening assessment.  Patient has history including, but not limited to, pancreatic mass/ cancer with biliary obstruction; DM; protein calorie malnutrition.  HIPAA compliant voice mail message left for patient, requesting return call back.  Plan:  Will place [facility] CM unsuccessful patient outreach letter in mail requesting call back in writing  Will re-attempt [place] telephone outreach again tomorrow if I do not hear back from patient first.  Oneta Rack, RN, BSN, [employer] Coordinator [facility] Care Management  [phone]

## 2018-06-26 ENCOUNTER — Ambulatory Visit: Payer: Medicare Other | Admitting: *Deleted

## 2018-07-17 DIAGNOSIS — C251 Malignant neoplasm of body of pancreas: Secondary | ICD-10-CM | POA: Diagnosis not present

## 2018-07-20 DIAGNOSIS — C259 Malignant neoplasm of pancreas, unspecified: Secondary | ICD-10-CM | POA: Diagnosis not present

## 2018-07-20 DIAGNOSIS — C251 Malignant neoplasm of body of pancreas: Secondary | ICD-10-CM | POA: Diagnosis not present

## 2018-07-23 DIAGNOSIS — R1084 Generalized abdominal pain: Secondary | ICD-10-CM | POA: Diagnosis not present

## 2018-07-23 DIAGNOSIS — C251 Malignant neoplasm of body of pancreas: Secondary | ICD-10-CM | POA: Diagnosis not present

## 2018-07-23 DIAGNOSIS — R0602 Shortness of breath: Secondary | ICD-10-CM | POA: Diagnosis not present

## 2018-07-23 DIAGNOSIS — R109 Unspecified abdominal pain: Secondary | ICD-10-CM | POA: Diagnosis not present

## 2018-07-23 DIAGNOSIS — R509 Fever, unspecified: Secondary | ICD-10-CM | POA: Diagnosis not present

## 2018-07-23 DIAGNOSIS — R000 Tachycardia, unspecified: Secondary | ICD-10-CM | POA: Diagnosis not present

## 2018-07-23 DIAGNOSIS — C259 Malignant neoplasm of pancreas, unspecified: Secondary | ICD-10-CM | POA: Diagnosis not present

## 2018-07-24 DIAGNOSIS — K828 Other specified diseases of gallbladder: Secondary | ICD-10-CM | POA: Diagnosis not present

## 2018-07-24 DIAGNOSIS — K7460 Unspecified cirrhosis of liver: Secondary | ICD-10-CM | POA: Diagnosis not present

## 2018-07-24 DIAGNOSIS — R7881 Bacteremia: Secondary | ICD-10-CM | POA: Diagnosis not present

## 2018-07-24 DIAGNOSIS — A419 Sepsis, unspecified organism: Secondary | ICD-10-CM | POA: Diagnosis not present

## 2018-07-24 DIAGNOSIS — C259 Malignant neoplasm of pancreas, unspecified: Secondary | ICD-10-CM | POA: Diagnosis not present

## 2018-07-24 DIAGNOSIS — R000 Tachycardia, unspecified: Secondary | ICD-10-CM | POA: Diagnosis not present

## 2018-07-24 DIAGNOSIS — R509 Fever, unspecified: Secondary | ICD-10-CM | POA: Diagnosis not present

## 2018-07-25 DIAGNOSIS — R509 Fever, unspecified: Secondary | ICD-10-CM | POA: Diagnosis not present

## 2018-07-25 DIAGNOSIS — R000 Tachycardia, unspecified: Secondary | ICD-10-CM | POA: Diagnosis not present

## 2018-07-25 DIAGNOSIS — A419 Sepsis, unspecified organism: Secondary | ICD-10-CM | POA: Diagnosis not present

## 2018-07-25 DIAGNOSIS — R7881 Bacteremia: Secondary | ICD-10-CM | POA: Diagnosis not present

## 2018-07-25 DIAGNOSIS — C259 Malignant neoplasm of pancreas, unspecified: Secondary | ICD-10-CM | POA: Diagnosis not present

## 2018-07-26 DIAGNOSIS — R000 Tachycardia, unspecified: Secondary | ICD-10-CM | POA: Diagnosis not present

## 2018-07-26 DIAGNOSIS — R7881 Bacteremia: Secondary | ICD-10-CM | POA: Diagnosis not present

## 2018-07-26 DIAGNOSIS — C259 Malignant neoplasm of pancreas, unspecified: Secondary | ICD-10-CM | POA: Diagnosis not present

## 2018-07-26 DIAGNOSIS — R509 Fever, unspecified: Secondary | ICD-10-CM | POA: Diagnosis not present

## 2018-07-27 DIAGNOSIS — C259 Malignant neoplasm of pancreas, unspecified: Secondary | ICD-10-CM | POA: Diagnosis not present

## 2018-07-27 DIAGNOSIS — R000 Tachycardia, unspecified: Secondary | ICD-10-CM | POA: Diagnosis not present

## 2018-07-27 DIAGNOSIS — R509 Fever, unspecified: Secondary | ICD-10-CM | POA: Diagnosis not present

## 2018-07-27 DIAGNOSIS — R7881 Bacteremia: Secondary | ICD-10-CM | POA: Diagnosis not present

## 2018-07-28 DIAGNOSIS — C259 Malignant neoplasm of pancreas, unspecified: Secondary | ICD-10-CM | POA: Diagnosis not present

## 2018-07-28 DIAGNOSIS — R7881 Bacteremia: Secondary | ICD-10-CM | POA: Diagnosis not present

## 2018-07-28 DIAGNOSIS — R000 Tachycardia, unspecified: Secondary | ICD-10-CM | POA: Diagnosis not present

## 2018-07-28 DIAGNOSIS — R509 Fever, unspecified: Secondary | ICD-10-CM | POA: Diagnosis not present

## 2018-07-29 DIAGNOSIS — R7881 Bacteremia: Secondary | ICD-10-CM | POA: Diagnosis not present

## 2018-07-29 DIAGNOSIS — R509 Fever, unspecified: Secondary | ICD-10-CM | POA: Diagnosis not present

## 2018-07-29 DIAGNOSIS — C259 Malignant neoplasm of pancreas, unspecified: Secondary | ICD-10-CM | POA: Diagnosis not present

## 2018-07-29 DIAGNOSIS — R000 Tachycardia, unspecified: Secondary | ICD-10-CM | POA: Diagnosis not present

## 2018-07-30 DIAGNOSIS — R000 Tachycardia, unspecified: Secondary | ICD-10-CM | POA: Diagnosis not present

## 2018-07-30 DIAGNOSIS — R509 Fever, unspecified: Secondary | ICD-10-CM | POA: Diagnosis not present

## 2018-07-30 DIAGNOSIS — R7881 Bacteremia: Secondary | ICD-10-CM | POA: Diagnosis not present

## 2018-07-30 DIAGNOSIS — C259 Malignant neoplasm of pancreas, unspecified: Secondary | ICD-10-CM | POA: Diagnosis not present

## 2018-07-31 DIAGNOSIS — R000 Tachycardia, unspecified: Secondary | ICD-10-CM | POA: Diagnosis not present

## 2018-07-31 DIAGNOSIS — R509 Fever, unspecified: Secondary | ICD-10-CM | POA: Diagnosis not present

## 2018-07-31 DIAGNOSIS — Z452 Encounter for adjustment and management of vascular access device: Secondary | ICD-10-CM | POA: Diagnosis not present

## 2018-07-31 DIAGNOSIS — R7881 Bacteremia: Secondary | ICD-10-CM | POA: Diagnosis not present

## 2018-07-31 DIAGNOSIS — C259 Malignant neoplasm of pancreas, unspecified: Secondary | ICD-10-CM | POA: Diagnosis not present

## 2018-08-01 DIAGNOSIS — R509 Fever, unspecified: Secondary | ICD-10-CM | POA: Diagnosis not present

## 2018-08-01 DIAGNOSIS — R000 Tachycardia, unspecified: Secondary | ICD-10-CM | POA: Diagnosis not present

## 2018-08-01 DIAGNOSIS — R7881 Bacteremia: Secondary | ICD-10-CM | POA: Diagnosis not present

## 2018-08-01 DIAGNOSIS — C259 Malignant neoplasm of pancreas, unspecified: Secondary | ICD-10-CM | POA: Diagnosis not present

## 2018-08-02 DIAGNOSIS — R7881 Bacteremia: Secondary | ICD-10-CM | POA: Diagnosis not present

## 2018-08-02 DIAGNOSIS — R000 Tachycardia, unspecified: Secondary | ICD-10-CM | POA: Diagnosis not present

## 2018-08-02 DIAGNOSIS — C259 Malignant neoplasm of pancreas, unspecified: Secondary | ICD-10-CM | POA: Diagnosis not present

## 2018-08-02 DIAGNOSIS — Z452 Encounter for adjustment and management of vascular access device: Secondary | ICD-10-CM | POA: Diagnosis not present

## 2018-08-02 DIAGNOSIS — R509 Fever, unspecified: Secondary | ICD-10-CM | POA: Diagnosis not present

## 2018-08-03 DIAGNOSIS — R509 Fever, unspecified: Secondary | ICD-10-CM | POA: Diagnosis not present

## 2018-08-03 DIAGNOSIS — C259 Malignant neoplasm of pancreas, unspecified: Secondary | ICD-10-CM | POA: Diagnosis not present

## 2018-08-03 DIAGNOSIS — C786 Secondary malignant neoplasm of retroperitoneum and peritoneum: Secondary | ICD-10-CM | POA: Diagnosis not present

## 2018-08-03 DIAGNOSIS — G893 Neoplasm related pain (acute) (chronic): Secondary | ICD-10-CM | POA: Diagnosis not present

## 2018-08-03 DIAGNOSIS — R000 Tachycardia, unspecified: Secondary | ICD-10-CM | POA: Diagnosis not present

## 2018-08-03 DIAGNOSIS — R7881 Bacteremia: Secondary | ICD-10-CM | POA: Diagnosis not present

## 2018-08-03 DIAGNOSIS — C7989 Secondary malignant neoplasm of other specified sites: Secondary | ICD-10-CM | POA: Diagnosis not present

## 2018-08-03 DIAGNOSIS — C801 Malignant (primary) neoplasm, unspecified: Secondary | ICD-10-CM | POA: Diagnosis not present

## 2018-08-04 DIAGNOSIS — F22 Delusional disorders: Secondary | ICD-10-CM | POA: Diagnosis not present

## 2018-08-04 DIAGNOSIS — R000 Tachycardia, unspecified: Secondary | ICD-10-CM | POA: Diagnosis not present

## 2018-08-04 DIAGNOSIS — R5381 Other malaise: Secondary | ICD-10-CM | POA: Diagnosis not present

## 2018-08-04 DIAGNOSIS — E43 Unspecified severe protein-calorie malnutrition: Secondary | ICD-10-CM | POA: Diagnosis not present

## 2018-08-04 DIAGNOSIS — Z7401 Bed confinement status: Secondary | ICD-10-CM | POA: Diagnosis not present

## 2018-08-04 DIAGNOSIS — G893 Neoplasm related pain (acute) (chronic): Secondary | ICD-10-CM | POA: Diagnosis not present

## 2018-08-04 DIAGNOSIS — R509 Fever, unspecified: Secondary | ICD-10-CM | POA: Diagnosis not present

## 2018-08-04 DIAGNOSIS — K219 Gastro-esophageal reflux disease without esophagitis: Secondary | ICD-10-CM | POA: Diagnosis not present

## 2018-08-04 DIAGNOSIS — I10 Essential (primary) hypertension: Secondary | ICD-10-CM | POA: Diagnosis not present

## 2018-08-04 DIAGNOSIS — M2550 Pain in unspecified joint: Secondary | ICD-10-CM | POA: Diagnosis not present

## 2018-08-04 DIAGNOSIS — C786 Secondary malignant neoplasm of retroperitoneum and peritoneum: Secondary | ICD-10-CM | POA: Diagnosis not present

## 2018-08-04 DIAGNOSIS — C259 Malignant neoplasm of pancreas, unspecified: Secondary | ICD-10-CM | POA: Diagnosis not present

## 2018-08-04 DIAGNOSIS — K5900 Constipation, unspecified: Secondary | ICD-10-CM | POA: Diagnosis not present

## 2018-08-04 DIAGNOSIS — R7881 Bacteremia: Secondary | ICD-10-CM | POA: Diagnosis not present

## 2018-08-06 DIAGNOSIS — E43 Unspecified severe protein-calorie malnutrition: Secondary | ICD-10-CM | POA: Diagnosis not present

## 2018-08-06 DIAGNOSIS — G893 Neoplasm related pain (acute) (chronic): Secondary | ICD-10-CM | POA: Diagnosis not present

## 2018-08-06 DIAGNOSIS — R7881 Bacteremia: Secondary | ICD-10-CM | POA: Diagnosis not present

## 2018-08-06 DIAGNOSIS — C259 Malignant neoplasm of pancreas, unspecified: Secondary | ICD-10-CM | POA: Diagnosis not present

## 2018-08-10 DIAGNOSIS — R7881 Bacteremia: Secondary | ICD-10-CM | POA: Diagnosis not present

## 2018-08-10 DIAGNOSIS — G893 Neoplasm related pain (acute) (chronic): Secondary | ICD-10-CM | POA: Diagnosis not present

## 2018-08-10 DIAGNOSIS — E43 Unspecified severe protein-calorie malnutrition: Secondary | ICD-10-CM | POA: Diagnosis not present

## 2018-08-10 DIAGNOSIS — C259 Malignant neoplasm of pancreas, unspecified: Secondary | ICD-10-CM | POA: Diagnosis not present

## 2018-08-13 DIAGNOSIS — C254 Malignant neoplasm of endocrine pancreas: Secondary | ICD-10-CM | POA: Diagnosis not present

## 2018-08-13 DIAGNOSIS — C259 Malignant neoplasm of pancreas, unspecified: Secondary | ICD-10-CM | POA: Diagnosis not present

## 2018-08-13 DIAGNOSIS — I10 Essential (primary) hypertension: Secondary | ICD-10-CM | POA: Diagnosis not present

## 2018-08-21 DIAGNOSIS — C251 Malignant neoplasm of body of pancreas: Secondary | ICD-10-CM | POA: Diagnosis not present

## 2018-08-22 DIAGNOSIS — C259 Malignant neoplasm of pancreas, unspecified: Secondary | ICD-10-CM | POA: Diagnosis not present

## 2018-08-23 DIAGNOSIS — G2581 Restless legs syndrome: Secondary | ICD-10-CM | POA: Diagnosis not present

## 2018-08-23 DIAGNOSIS — K219 Gastro-esophageal reflux disease without esophagitis: Secondary | ICD-10-CM | POA: Diagnosis not present

## 2018-08-23 DIAGNOSIS — Z452 Encounter for adjustment and management of vascular access device: Secondary | ICD-10-CM | POA: Diagnosis not present

## 2018-08-23 DIAGNOSIS — Z79899 Other long term (current) drug therapy: Secondary | ICD-10-CM | POA: Diagnosis not present

## 2018-08-23 DIAGNOSIS — I10 Essential (primary) hypertension: Secondary | ICD-10-CM | POA: Diagnosis not present

## 2018-08-23 DIAGNOSIS — M159 Polyosteoarthritis, unspecified: Secondary | ICD-10-CM | POA: Diagnosis not present

## 2018-08-23 DIAGNOSIS — C259 Malignant neoplasm of pancreas, unspecified: Secondary | ICD-10-CM | POA: Diagnosis not present

## 2018-08-27 DIAGNOSIS — C251 Malignant neoplasm of body of pancreas: Secondary | ICD-10-CM | POA: Diagnosis not present

## 2018-08-28 DIAGNOSIS — M19041 Primary osteoarthritis, right hand: Secondary | ICD-10-CM | POA: Diagnosis not present

## 2018-08-28 DIAGNOSIS — R531 Weakness: Secondary | ICD-10-CM | POA: Diagnosis not present

## 2018-08-28 DIAGNOSIS — G5631 Lesion of radial nerve, right upper limb: Secondary | ICD-10-CM | POA: Diagnosis not present

## 2018-08-28 DIAGNOSIS — R930 Abnormal findings on diagnostic imaging of skull and head, not elsewhere classified: Secondary | ICD-10-CM | POA: Diagnosis not present

## 2018-08-28 DIAGNOSIS — C259 Malignant neoplasm of pancreas, unspecified: Secondary | ICD-10-CM | POA: Diagnosis not present

## 2018-08-28 DIAGNOSIS — M21331 Wrist drop, right wrist: Secondary | ICD-10-CM | POA: Diagnosis not present

## 2018-09-03 DIAGNOSIS — C251 Malignant neoplasm of body of pancreas: Secondary | ICD-10-CM | POA: Diagnosis not present

## 2018-09-06 DIAGNOSIS — R29898 Other symptoms and signs involving the musculoskeletal system: Secondary | ICD-10-CM | POA: Diagnosis not present

## 2018-09-06 DIAGNOSIS — M4802 Spinal stenosis, cervical region: Secondary | ICD-10-CM | POA: Diagnosis not present

## 2018-09-06 DIAGNOSIS — C251 Malignant neoplasm of body of pancreas: Secondary | ICD-10-CM | POA: Diagnosis not present

## 2018-09-06 DIAGNOSIS — M50322 Other cervical disc degeneration at C5-C6 level: Secondary | ICD-10-CM | POA: Diagnosis not present

## 2018-09-06 DIAGNOSIS — R531 Weakness: Secondary | ICD-10-CM | POA: Diagnosis not present

## 2018-09-06 DIAGNOSIS — I6782 Cerebral ischemia: Secondary | ICD-10-CM | POA: Diagnosis not present

## 2018-09-06 DIAGNOSIS — M50321 Other cervical disc degeneration at C4-C5 level: Secondary | ICD-10-CM | POA: Diagnosis not present

## 2018-09-10 DIAGNOSIS — Z5111 Encounter for antineoplastic chemotherapy: Secondary | ICD-10-CM | POA: Diagnosis not present

## 2018-09-10 DIAGNOSIS — C251 Malignant neoplasm of body of pancreas: Secondary | ICD-10-CM | POA: Diagnosis not present

## 2018-09-20 DIAGNOSIS — K219 Gastro-esophageal reflux disease without esophagitis: Secondary | ICD-10-CM | POA: Diagnosis not present

## 2018-09-20 DIAGNOSIS — R609 Edema, unspecified: Secondary | ICD-10-CM | POA: Diagnosis not present

## 2018-09-20 DIAGNOSIS — I10 Essential (primary) hypertension: Secondary | ICD-10-CM | POA: Diagnosis not present

## 2018-09-20 DIAGNOSIS — H6123 Impacted cerumen, bilateral: Secondary | ICD-10-CM | POA: Diagnosis not present

## 2018-09-24 DIAGNOSIS — C251 Malignant neoplasm of body of pancreas: Secondary | ICD-10-CM

## 2018-09-24 DIAGNOSIS — C786 Secondary malignant neoplasm of retroperitoneum and peritoneum: Secondary | ICD-10-CM | POA: Diagnosis not present

## 2018-10-02 DIAGNOSIS — C259 Malignant neoplasm of pancreas, unspecified: Secondary | ICD-10-CM | POA: Diagnosis not present

## 2018-10-02 DIAGNOSIS — R29898 Other symptoms and signs involving the musculoskeletal system: Secondary | ICD-10-CM | POA: Diagnosis not present

## 2018-10-02 DIAGNOSIS — Z6821 Body mass index (BMI) 21.0-21.9, adult: Secondary | ICD-10-CM | POA: Diagnosis not present

## 2018-10-10 DIAGNOSIS — C786 Secondary malignant neoplasm of retroperitoneum and peritoneum: Secondary | ICD-10-CM | POA: Diagnosis not present

## 2018-10-10 DIAGNOSIS — Z5111 Encounter for antineoplastic chemotherapy: Secondary | ICD-10-CM | POA: Diagnosis not present

## 2018-10-10 DIAGNOSIS — C251 Malignant neoplasm of body of pancreas: Secondary | ICD-10-CM | POA: Diagnosis not present

## 2018-10-11 DIAGNOSIS — G5631 Lesion of radial nerve, right upper limb: Secondary | ICD-10-CM | POA: Diagnosis not present

## 2018-10-15 DIAGNOSIS — C257 Malignant neoplasm of other parts of pancreas: Secondary | ICD-10-CM | POA: Diagnosis not present

## 2018-10-15 DIAGNOSIS — Z808 Family history of malignant neoplasm of other organs or systems: Secondary | ICD-10-CM | POA: Diagnosis not present

## 2018-10-17 DIAGNOSIS — M6281 Muscle weakness (generalized): Secondary | ICD-10-CM | POA: Diagnosis not present

## 2018-10-17 DIAGNOSIS — G5631 Lesion of radial nerve, right upper limb: Secondary | ICD-10-CM | POA: Diagnosis not present

## 2018-10-17 DIAGNOSIS — M25641 Stiffness of right hand, not elsewhere classified: Secondary | ICD-10-CM | POA: Diagnosis not present

## 2018-10-17 DIAGNOSIS — R29818 Other symptoms and signs involving the nervous system: Secondary | ICD-10-CM | POA: Diagnosis not present

## 2018-10-22 DIAGNOSIS — C259 Malignant neoplasm of pancreas, unspecified: Secondary | ICD-10-CM | POA: Diagnosis not present

## 2018-10-22 DIAGNOSIS — R6520 Severe sepsis without septic shock: Secondary | ICD-10-CM | POA: Diagnosis not present

## 2018-10-22 DIAGNOSIS — R1110 Vomiting, unspecified: Secondary | ICD-10-CM | POA: Diagnosis not present

## 2018-10-22 DIAGNOSIS — K831 Obstruction of bile duct: Secondary | ICD-10-CM | POA: Diagnosis not present

## 2018-10-22 DIAGNOSIS — R509 Fever, unspecified: Secondary | ICD-10-CM | POA: Diagnosis not present

## 2018-10-22 DIAGNOSIS — R6521 Severe sepsis with septic shock: Secondary | ICD-10-CM | POA: Diagnosis not present

## 2018-10-22 DIAGNOSIS — A419 Sepsis, unspecified organism: Secondary | ICD-10-CM | POA: Diagnosis not present

## 2018-10-22 DIAGNOSIS — R180 Malignant ascites: Secondary | ICD-10-CM | POA: Diagnosis not present

## 2018-10-23 ENCOUNTER — Inpatient Hospital Stay (HOSPITAL_COMMUNITY)
Admission: EM | Admit: 2018-10-23 | Discharge: 2018-11-01 | DRG: 871 | Disposition: A | Discharge status: Home Health | Payer: Medicare Other | Source: Other Acute Inpatient Hospital | Attending: Internal Medicine | Admitting: Internal Medicine

## 2018-10-23 DIAGNOSIS — C250 Malignant neoplasm of head of pancreas: Secondary | ICD-10-CM | POA: Diagnosis not present

## 2018-10-23 DIAGNOSIS — Z4689 Encounter for fitting and adjustment of other specified devices: Secondary | ICD-10-CM

## 2018-10-23 DIAGNOSIS — G9340 Encephalopathy, unspecified: Secondary | ICD-10-CM

## 2018-10-23 DIAGNOSIS — R069 Unspecified abnormalities of breathing: Secondary | ICD-10-CM

## 2018-10-23 DIAGNOSIS — E43 Unspecified severe protein-calorie malnutrition: Secondary | ICD-10-CM | POA: Diagnosis present

## 2018-10-23 DIAGNOSIS — Z681 Body mass index (BMI) 19 or less, adult: Secondary | ICD-10-CM | POA: Diagnosis not present

## 2018-10-23 DIAGNOSIS — R180 Malignant ascites: Secondary | ICD-10-CM | POA: Diagnosis present

## 2018-10-23 DIAGNOSIS — Y92230 Patient room in hospital as the place of occurrence of the external cause: Secondary | ICD-10-CM | POA: Diagnosis not present

## 2018-10-23 DIAGNOSIS — R627 Adult failure to thrive: Secondary | ICD-10-CM | POA: Diagnosis present

## 2018-10-23 DIAGNOSIS — K08109 Complete loss of teeth, unspecified cause, unspecified class: Secondary | ICD-10-CM | POA: Diagnosis present

## 2018-10-23 DIAGNOSIS — K8050 Calculus of bile duct without cholangitis or cholecystitis without obstruction: Secondary | ICD-10-CM | POA: Diagnosis not present

## 2018-10-23 DIAGNOSIS — K8309 Other cholangitis: Secondary | ICD-10-CM | POA: Diagnosis not present

## 2018-10-23 DIAGNOSIS — Z7189 Other specified counseling: Secondary | ICD-10-CM

## 2018-10-23 DIAGNOSIS — C259 Malignant neoplasm of pancreas, unspecified: Secondary | ICD-10-CM | POA: Diagnosis present

## 2018-10-23 DIAGNOSIS — R945 Abnormal results of liver function studies: Secondary | ICD-10-CM | POA: Diagnosis not present

## 2018-10-23 DIAGNOSIS — R1110 Vomiting, unspecified: Secondary | ICD-10-CM | POA: Diagnosis not present

## 2018-10-23 DIAGNOSIS — Z794 Long term (current) use of insulin: Secondary | ICD-10-CM

## 2018-10-23 DIAGNOSIS — E876 Hypokalemia: Secondary | ICD-10-CM | POA: Diagnosis not present

## 2018-10-23 DIAGNOSIS — Z923 Personal history of irradiation: Secondary | ICD-10-CM

## 2018-10-23 DIAGNOSIS — Z20828 Contact with and (suspected) exposure to other viral communicable diseases: Secondary | ICD-10-CM | POA: Diagnosis present

## 2018-10-23 DIAGNOSIS — I81 Portal vein thrombosis: Secondary | ICD-10-CM | POA: Diagnosis present

## 2018-10-23 DIAGNOSIS — E860 Dehydration: Secondary | ICD-10-CM | POA: Diagnosis not present

## 2018-10-23 DIAGNOSIS — T85898A Other specified complication of other internal prosthetic devices, implants and grafts, initial encounter: Secondary | ICD-10-CM | POA: Diagnosis present

## 2018-10-23 DIAGNOSIS — Y831 Surgical operation with implant of artificial internal device as the cause of abnormal reaction of the patient, or of later complication, without mention of misadventure at the time of the procedure: Secondary | ICD-10-CM | POA: Diagnosis present

## 2018-10-23 DIAGNOSIS — Z79899 Other long term (current) drug therapy: Secondary | ICD-10-CM

## 2018-10-23 DIAGNOSIS — K3189 Other diseases of stomach and duodenum: Secondary | ICD-10-CM | POA: Diagnosis not present

## 2018-10-23 DIAGNOSIS — R6521 Severe sepsis with septic shock: Secondary | ICD-10-CM | POA: Diagnosis present

## 2018-10-23 DIAGNOSIS — R579 Shock, unspecified: Secondary | ICD-10-CM | POA: Diagnosis not present

## 2018-10-23 DIAGNOSIS — Z66 Do not resuscitate: Secondary | ICD-10-CM | POA: Diagnosis present

## 2018-10-23 DIAGNOSIS — R000 Tachycardia, unspecified: Secondary | ICD-10-CM | POA: Diagnosis not present

## 2018-10-23 DIAGNOSIS — T402X5A Adverse effect of other opioids, initial encounter: Secondary | ICD-10-CM | POA: Diagnosis not present

## 2018-10-23 DIAGNOSIS — A419 Sepsis, unspecified organism: Principal | ICD-10-CM | POA: Diagnosis present

## 2018-10-23 DIAGNOSIS — R64 Cachexia: Secondary | ICD-10-CM | POA: Diagnosis present

## 2018-10-23 DIAGNOSIS — R799 Abnormal finding of blood chemistry, unspecified: Secondary | ICD-10-CM | POA: Diagnosis not present

## 2018-10-23 DIAGNOSIS — C7800 Secondary malignant neoplasm of unspecified lung: Secondary | ICD-10-CM | POA: Diagnosis present

## 2018-10-23 DIAGNOSIS — C251 Malignant neoplasm of body of pancreas: Secondary | ICD-10-CM | POA: Diagnosis not present

## 2018-10-23 DIAGNOSIS — D689 Coagulation defect, unspecified: Secondary | ICD-10-CM | POA: Diagnosis present

## 2018-10-23 DIAGNOSIS — K8051 Calculus of bile duct without cholangitis or cholecystitis with obstruction: Secondary | ICD-10-CM | POA: Diagnosis not present

## 2018-10-23 DIAGNOSIS — K831 Obstruction of bile duct: Secondary | ICD-10-CM | POA: Diagnosis not present

## 2018-10-23 DIAGNOSIS — Z79891 Long term (current) use of opiate analgesic: Secondary | ICD-10-CM

## 2018-10-23 DIAGNOSIS — Z515 Encounter for palliative care: Secondary | ICD-10-CM

## 2018-10-23 DIAGNOSIS — Y732 Prosthetic and other implants, materials and accessory gastroenterology and urology devices associated with adverse incidents: Secondary | ICD-10-CM | POA: Diagnosis present

## 2018-10-23 DIAGNOSIS — I10 Essential (primary) hypertension: Secondary | ICD-10-CM | POA: Diagnosis present

## 2018-10-23 DIAGNOSIS — C787 Secondary malignant neoplasm of liver and intrahepatic bile duct: Secondary | ICD-10-CM | POA: Diagnosis present

## 2018-10-23 DIAGNOSIS — D696 Thrombocytopenia, unspecified: Secondary | ICD-10-CM | POA: Diagnosis present

## 2018-10-23 DIAGNOSIS — K8031 Calculus of bile duct with cholangitis, unspecified, with obstruction: Secondary | ICD-10-CM | POA: Diagnosis present

## 2018-10-23 DIAGNOSIS — I82891 Chronic embolism and thrombosis of other specified veins: Secondary | ICD-10-CM | POA: Diagnosis present

## 2018-10-23 DIAGNOSIS — R188 Other ascites: Secondary | ICD-10-CM

## 2018-10-23 DIAGNOSIS — R0689 Other abnormalities of breathing: Secondary | ICD-10-CM | POA: Diagnosis not present

## 2018-10-23 DIAGNOSIS — K5903 Drug induced constipation: Secondary | ICD-10-CM | POA: Diagnosis not present

## 2018-10-23 DIAGNOSIS — R17 Unspecified jaundice: Secondary | ICD-10-CM | POA: Diagnosis not present

## 2018-10-23 DIAGNOSIS — R933 Abnormal findings on diagnostic imaging of other parts of digestive tract: Secondary | ICD-10-CM | POA: Diagnosis not present

## 2018-10-23 DIAGNOSIS — E640 Sequelae of protein-calorie malnutrition: Secondary | ICD-10-CM | POA: Diagnosis not present

## 2018-10-23 DIAGNOSIS — D649 Anemia, unspecified: Secondary | ICD-10-CM | POA: Diagnosis not present

## 2018-10-23 DIAGNOSIS — F419 Anxiety disorder, unspecified: Secondary | ICD-10-CM | POA: Diagnosis present

## 2018-10-23 DIAGNOSIS — D630 Anemia in neoplastic disease: Secondary | ICD-10-CM | POA: Diagnosis present

## 2018-10-23 DIAGNOSIS — R6520 Severe sepsis without septic shock: Secondary | ICD-10-CM | POA: Diagnosis not present

## 2018-10-23 DIAGNOSIS — R0602 Shortness of breath: Secondary | ICD-10-CM | POA: Diagnosis not present

## 2018-10-23 DIAGNOSIS — K838 Other specified diseases of biliary tract: Secondary | ICD-10-CM | POA: Diagnosis not present

## 2018-10-23 DIAGNOSIS — K8689 Other specified diseases of pancreas: Secondary | ICD-10-CM | POA: Diagnosis not present

## 2018-10-23 LAB — COMPREHENSIVE METABOLIC PANEL
ALT: 74 U/L — ABNORMAL HIGH (ref 0–44)
AST: 121 U/L — ABNORMAL HIGH (ref 15–41)
Albumin: 2.6 g/dL — ABNORMAL LOW (ref 3.5–5.0)
Alkaline Phosphatase: 535 U/L — ABNORMAL HIGH (ref 38–126)
Anion gap: 11 (ref 5–15)
BUN: 16 mg/dL (ref 8–23)
CO2: 30 mmol/L (ref 22–32)
Calcium: 8.9 mg/dL (ref 8.9–10.3)
Chloride: 100 mmol/L (ref 98–111)
Creatinine, Ser: 0.73 mg/dL (ref 0.61–1.24)
GFR calc Af Amer: >60 mL/min (ref >60)
GFR calc non Af Amer: >60 mL/min (ref >60)
Glucose, Bld: 129 mg/dL — ABNORMAL HIGH (ref 70–99)
Potassium: 3.5 mmol/L (ref 3.5–5.1)
Sodium: 141 mmol/L (ref 135–145)
Total Bilirubin: 4.6 mg/dL — ABNORMAL HIGH (ref 0.3–1.2)
Total Protein: 5.9 g/dL — ABNORMAL LOW (ref 6.5–8.1)

## 2018-10-23 LAB — CBC WITH DIFFERENTIAL/PLATELET
Abs Immature Granulocytes: 0.78 10*3/uL — ABNORMAL HIGH (ref 0.00–0.07)
Basophils Absolute: 0 10*3/uL (ref 0.0–0.1)
Basophils Relative: 0 %
Eosinophils Absolute: 0 10*3/uL (ref 0.0–0.5)
Eosinophils Relative: 0 %
HCT: 29.7 % — ABNORMAL LOW (ref 39.0–52.0)
Hemoglobin: 9.8 g/dL — ABNORMAL LOW (ref 13.0–17.0)
Immature Granulocytes: 8 %
Lymphocytes Relative: 3 %
Lymphs Abs: 0.3 10*3/uL — ABNORMAL LOW (ref 0.7–4.0)
MCH: 32.7 pg (ref 26.0–34.0)
MCHC: 33 g/dL (ref 30.0–36.0)
MCV: 99 fL (ref 80.0–100.0)
Monocytes Absolute: 0.3 10*3/uL (ref 0.1–1.0)
Monocytes Relative: 3 %
Neutro Abs: 8 10*3/uL — ABNORMAL HIGH (ref 1.7–7.7)
Neutrophils Relative %: 86 %
Platelets: 83 10*3/uL — ABNORMAL LOW (ref 150–400)
RBC: 3 MIL/uL — ABNORMAL LOW (ref 4.22–5.81)
RDW: 16.6 % — ABNORMAL HIGH (ref 11.5–15.5)
WBC: 9.3 10*3/uL (ref 4.0–10.5)
nRBC: 0 % (ref 0.0–0.2)

## 2018-10-23 LAB — POCT I-STAT 7, (LYTES, BLD GAS, ICA,H+H)
Acid-Base Excess: 8 mmol/L — ABNORMAL HIGH (ref 0.0–2.0)
Bicarbonate: 31.9 mmol/L — ABNORMAL HIGH (ref 20.0–28.0)
Calcium, Ion: 1.22 mmol/L (ref 1.15–1.40)
HCT: 25 % — ABNORMAL LOW (ref 39.0–52.0)
Hemoglobin: 8.5 g/dL — ABNORMAL LOW (ref 13.0–17.0)
O2 Saturation: 96 %
Patient temperature: 99.3
Potassium: 3.2 mmol/L — ABNORMAL LOW (ref 3.5–5.1)
Sodium: 141 mmol/L (ref 135–145)
TCO2: 33 mmol/L — ABNORMAL HIGH (ref 22–32)
pCO2 arterial: 39.3 mmHg (ref 32.0–48.0)
pH, Arterial: 7.519 — ABNORMAL HIGH (ref 7.350–7.450)
pO2, Arterial: 73 mmHg — ABNORMAL LOW (ref 83.0–108.0)

## 2018-10-23 LAB — PROTIME-INR
INR: 2 — ABNORMAL HIGH (ref 0.8–1.2)
Prothrombin Time: 22.8 seconds — ABNORMAL HIGH (ref 11.4–15.2)

## 2018-10-23 LAB — AMYLASE: Amylase: 18 U/L — ABNORMAL LOW (ref 28–100)

## 2018-10-23 LAB — CORTISOL: Cortisol, Plasma: 25.7 ug/dL

## 2018-10-23 LAB — GLUCOSE, CAPILLARY
Glucose-Capillary: 118 mg/dL — ABNORMAL HIGH (ref 70–99)
Glucose-Capillary: 120 mg/dL — ABNORMAL HIGH (ref 70–99)
Glucose-Capillary: 93 mg/dL (ref 70–99)

## 2018-10-23 LAB — PHOSPHORUS: Phosphorus: 2.4 mg/dL — ABNORMAL LOW (ref 2.5–4.6)

## 2018-10-23 LAB — LACTIC ACID, PLASMA
Lactic Acid, Venous: 2.4 mmol/L (ref 0.5–1.9)
Lactic Acid, Venous: 2.3 mmol/L (ref 0.5–1.9)

## 2018-10-23 LAB — PROCALCITONIN: Procalcitonin: 42.71 ng/mL

## 2018-10-23 LAB — MRSA PCR SCREENING: MRSA by PCR: NEGATIVE

## 2018-10-23 LAB — LIPASE, BLOOD: Lipase: 18 U/L (ref 11–51)

## 2018-10-23 LAB — APTT: aPTT: 39 seconds — ABNORMAL HIGH (ref 24–36)

## 2018-10-23 LAB — MAGNESIUM: Magnesium: 1.5 mg/dL — ABNORMAL LOW (ref 1.7–2.4)

## 2018-10-23 MED ORDER — SODIUM CHLORIDE 0.9 % IV SOLN
250.0000 mL | INTRAVENOUS | Status: DC
  Administered 2018-10-25: 30 mL via INTRAVENOUS
  Administered 2018-10-25 – 2018-10-28 (×4): 250 mL via INTRAVENOUS
  Administered 2018-10-28: 21:00:00 10 mL via INTRAVENOUS
  Administered 2018-10-29: 250 mL via INTRAVENOUS

## 2018-10-23 MED ORDER — FENTANYL CITRATE (PF) 100 MCG/2ML IJ SOLN
25.0000 ug | INTRAMUSCULAR | Status: DC | PRN
  Administered 2018-10-23: 50 ug via INTRAVENOUS
  Administered 2018-10-23: 25 ug via INTRAVENOUS
  Administered 2018-10-23 – 2018-10-25 (×6): 50 ug via INTRAVENOUS
  Filled 2018-10-23 (×8): qty 2

## 2018-10-23 MED ORDER — HEPARIN SODIUM (PORCINE) 5000 UNIT/ML IJ SOLN
5000.0000 [IU] | Freq: Three times a day (TID) | INTRAMUSCULAR | Status: AC
  Administered 2018-10-23 – 2018-10-25 (×8): 5000 [IU] via SUBCUTANEOUS
  Filled 2018-10-23 (×8): qty 1

## 2018-10-23 MED ORDER — VANCOMYCIN HCL IN DEXTROSE 750-5 MG/150ML-% IV SOLN
750.0000 mg | Freq: Two times a day (BID) | INTRAVENOUS | Status: DC
  Administered 2018-10-23: 750 mg via INTRAVENOUS
  Filled 2018-10-23 (×2): qty 150

## 2018-10-23 MED ORDER — VANCOMYCIN HCL IN DEXTROSE 1-5 GM/200ML-% IV SOLN
1000.0000 mg | INTRAVENOUS | Status: AC
  Administered 2018-10-23: 11:00:00 1000 mg via INTRAVENOUS
  Filled 2018-10-23: qty 200

## 2018-10-23 MED ORDER — PIPERACILLIN-TAZOBACTAM 3.375 G IVPB 30 MIN
3.3750 g | INTRAVENOUS | Status: AC
  Administered 2018-10-23: 3.375 g via INTRAVENOUS
  Filled 2018-10-23: qty 50

## 2018-10-23 MED ORDER — PANTOPRAZOLE SODIUM 40 MG IV SOLR
40.0000 mg | Freq: Every day | INTRAVENOUS | Status: DC
  Administered 2018-10-23 – 2018-10-24 (×2): 40 mg via INTRAVENOUS
  Filled 2018-10-23 (×2): qty 40

## 2018-10-23 MED ORDER — PIPERACILLIN-TAZOBACTAM 3.375 G IVPB
3.3750 g | Freq: Three times a day (TID) | INTRAVENOUS | Status: DC
  Administered 2018-10-23 – 2018-10-30 (×20): 3.375 g via INTRAVENOUS
  Filled 2018-10-23 (×22): qty 50

## 2018-10-23 MED ORDER — LACTATED RINGERS IV SOLN
INTRAVENOUS | Status: DC
  Administered 2018-10-25 – 2018-10-26 (×2): via INTRAVENOUS

## 2018-10-23 MED ORDER — NOREPINEPHRINE 4 MG/250ML-% IV SOLN
5.0000 ug/min | INTRAVENOUS | Status: DC
  Administered 2018-10-23: 3 ug/min via INTRAVENOUS
  Filled 2018-10-23: qty 250

## 2018-10-23 NOTE — H&P (Addendum)
PULMONARY / CRITICAL CARE MEDICINE   NAME:  Tommy Oconnor, MRN:  [id number], DOB:  [birth year], LOS: 0 ADMISSION DATE:  10/23/2018, REFERRING MD: [facility] emergency department, CHIEF COMPLAINT: Hypotensive in the setting of sepsis  BRIEF HISTORY:    68 year old with a history of pancreatic cancer x1 year admitted with sepsis. 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  Tommy Oconnor is completed radiation therapy.  Currently receiving chemotherapy from an oncologist in [facility].  Tommy Oconnor reported [facility] 24 hours ago with a 1 day complaint of nausea vomiting diarrhea.  Found to be hypertensive and difficulty breathing.  Tommy Oconnor underwent a paracentesis removal of 2100 cc of fluid which improved his breathing status.  Treated with empirical antimicrobial therapy with Zosyn and Levaquin.Marland Kitchen  Tommy Oconnor is a limited CODE BLUE with no intubation no shock or CPR.  Vasoactive medications are allowed. SIGNIFICANT PAST MEDICAL HISTORY   Pancreatic cancer diagnosed 1 year ago status post radiation now on currently in chemotherapy Hypertension  SIGNIFICANT EVENTS:  10/23/2018 transferred from [facility] to [place]:    CULTURES:  10/23/2018 blood cultures x2>> 10/23/2018 urine culture>> 10/23/2018 sputum with obtainable>> 18 2020 procalcitonin>>  ANTIBIOTICS:  10/23/2018 vancomycin>> 10/23/2018 Zosyn>>  LINES/TUBES:  Port-A-Cath unknown date>> CONSULTANTS:   SUBJECTIVE:  68 year old male diagnosed pancreatic cancer 1 year ago that is metastatic.  Currently awake and alert no acute distress weaning Levophed.  CONSTITUTIONAL: BP 100/69   Pulse (!) 108   Resp 12   SpO2 97%   No intake/output data recorded.        PHYSICAL EXAM: General: Frail cachectic male who is awake alert follows commands with no complaints Neuro: Grossly intact  follows commands moves all extremities HEENT: No JVD lymphadenopathy is appreciated Cardiovascular: Heart sounds are regular regular rate and rhythm Lungs: Decreased breath sounds in the bases Abdomen: Soft nontender positive bowel sounds Musculoskeletal: Intact wasted musculature Skin: Warm and dry Port-A-Cath noted left chest  RESOLVED PROBLEM LIST   ASSESSMENT AND PLAN   Sepsis from presumed GI source.  In the setting of immunocompromised male with pancreatic cancer for 1 year having had past radiation and now currently enrolled in chemotherapy.  Chemotherapy agents are unknown at this time further investigation will be continued. Continue lactated Ringer's 100 cc an hour Tommy Oconnor does appear to be dry Wean Levophed to off as tolerated Continue antimicrobial therapy Check procalcitonin Transition antibiotics to vancomycin and Zosyn stop Levaquin Panculture  Pancreatic cancer Chemotherapy is on hold currently Hopefully sepsis will be shortly resolved Tommy Oconnor will return to [facility] to his oncologist continue treatment  Nausea vomiting diarrhea Clear liquids advance diet as tolerated Antiemetics as needed No evidence of diarrhea on admission  SUMMARY OF TODAY'S PLAN:  10/23/2018 patient transferred from [facility] after being admitted for 24 hours for nausea and vomiting some diarrhea.  And started on Levophed Tommy Oconnor also underwent a paracentesis with 2.1 L removed but no culture data back at this time.  Tommy Oconnor was placed on Zosyn and Levaquin at [facility].  Tommy Oconnor was transferred to [facility] on 10/23/2018 awake alert no acute distress receiving Levophed at a decreased dose via left port.  Tommy Oconnor stated plainly did not want to be intubated or shocked or chest compressions completed.  Therefore Tommy Oconnor is admitted as a limited CODE BLUE treat his blood pressures  nausea vomiting hopefully getting back to [facility] raise receiving chemotherapy per his oncologist at [place] / Goals of Care / Disposition.   DVT PROPHYLAXIS: Heparin SUP: PPI NUTRITION: Clear liquid MOBILITY: Out of bed as tolerated GOALS OF CARE: Limited CODE BLUE no shock or intubation.  Vasopressors are allowed FAMILY DISCUSSIONS: Patient updated at bedside DISPOSITION currently in intensive care unit receiving Levophed for low blood pressure  LABS  Glucose No results for input(s): GLUCAP in the last 168 hours.  BMET No results for input(s): NA, K, CL, CO2, BUN, CREATININE, GLUCOSE in the last 168 hours.  Liver Enzymes No results for input(s): AST, ALT, ALKPHOS, BILITOT, ALBUMIN in the last 168 hours.  Electrolytes No results for input(s): CALCIUM, MG, PHOS in the last 168 hours.  CBC No results for input(s): WBC, HGB, HCT, PLT in the last 168 hours.  ABG No results for input(s): PHART, PCO2ART, PO2ART in the last 168 hours.  Coag's No results for input(s): APTT, INR in the last 168 hours.  Sepsis Markers No results for input(s): LATICACIDVEN, PROCALCITON, O2SATVEN in the last 168 hours.  Cardiac Enzymes No results for input(s): TROPONINI, PROBNP in the last 168 hours.  PAST MEDICAL HISTORY :   Tommy Oconnor  has a past medical history of Pancreatic cancer ([place]).  Hypertension  PAST SURGICAL HISTORY:  Tommy Oconnor  has a past surgical history that includes EUS (N/A, 08/03/2017); Fine needle aspiration (N/A, 08/03/2017); Appendectomy; ERCP (N/A, 10/16/2017); sphincterotomy (10/16/2017); biliary stent placement (10/16/2017); Endoscopic retrograde cholangiopancreatography (ercp) with propofol (N/A, 02/09/2018); biliary stent placement (N/A, 02/09/2018); Endoscopic retrograde cholangiopancreatography (ercp) with propofol (N/A, 05/10/2018); Stent removal (05/10/2018); and biliary stent placement (N/A, 05/10/2018).  Allergies  Allergen Reactions  . [place]green [Methyl Salicylate] Rash    No current facility-administered medications on file prior to encounter.    Current  Outpatient Medications on File Prior to Encounter  Medication Sig  . CREON 36000 units CPEP capsule Take 98,264-15,830 Units by mouth See admin instructions. Take 2 capsules (72,000 units) three times daily with meals & take 1 capsule (36,000 units) by mouth twice daily with snacks.  . Insulin Pen Needle 31G X 5 MM MISC To be Used with Lantus pen-20units daily initially and then 10units daily in 1 week  . metFORMIN (GLUCOPHAGE) 500 MG tablet Take 500 mg by mouth 2 (two) times daily with a meal.   . methadone (DOLOPHINE) 10 MG tablet Take 10 mg by mouth 2 (two) times daily.   Marland Kitchen OLANZapine (ZYPREXA) 10 MG tablet Take 10 mg by mouth at bedtime. Take 1 tablet (10 mg) by mouth scheduled each night at bedtime; may take 0.5 tablet (5 mg) by mouth in the morning if experiencing severe nausea.  Marland Kitchen omeprazole (PRILOSEC) 20 MG capsule Take 20 mg by mouth 2 (two) times daily before a meal.   . Oxycodone HCl 10 MG TABS Take 10 mg by mouth every 4 (four) hours as needed (pain).     FAMILY HISTORY:   His family history includes Diabetes in his brother.  SOCIAL HISTORY:  Tommy Oconnor  reports that Tommy Oconnor has never smoked. Tommy Oconnor has never used smokeless tobacco. Tommy Oconnor reports previous drug use. Tommy Oconnor reports that Tommy Oconnor does not drink alcohol.  REVIEW OF SYSTEMS:    10 point review of system taken, please see HPI for positives and negatives.  Attending Note:  68 year old with pancreatic cancer 1 year ago who presents to PCCM with septic shock unknown source.  On  exam, Tommy Oconnor is pleasant alert and interactive, moving all ext to command with clear lungs.  I reviewed CXR myself, no infiltrate noted.  Discussed with PCCM-NP.  Will admit to the ICU.  Levophed as ordered.  Change abx to vanc/zosyn.  Pan cultures.  Fluid resuscitation.  GOC discussion, LCB now.  PCCM will continue to manage.  The patient is critically ill with multiple organ systems failure and requires high complexity decision making for assessment and support, frequent  evaluation and titration of therapies, application of advanced monitoring technologies and extensive interpretation of multiple databases.   Critical Care Time devoted to patient care services described in this note is  38  Minutes. This time reflects time of care of this signee Dr Jennet Maduro. This critical care time does not reflect procedure time, or teaching time or supervisory time of PA/NP/Med student/Med Resident etc but could involve care discussion time.  Rush Farmer, M.D. [facility] Pulmonary/Critical Care Medicine. Pager: [phone]. After hours pager: [phone].

## 2018-10-23 NOTE — Progress Notes (Signed)
Pharmacy Antibiotic Note  Tommy Oconnor is a 68 y.o. male admitted on 10/23/2018 with Sepsis from presumed GI tract. Immunocompromised.  Pharmacy has been consulted for Vanco/Zosyn dosing.  CC/HPI: N/V/D, HTN, sepsis treated in [facility] with Zosyn/Levaquin. underwent a paracentesis with 2.1 L removed. Transferred to [facility] on Levophed at 37mcg/min  PMH: HTN, arthritis in feet, anemia, pancreatic cancer x1 year s/p radiation undergoing chemo.  Significant events:  8/17: Scr 0.6, Tbili 4, AST 192, ALT 78, LDH 617, AlkPhos 672, CRP 60, proBNP 3950, Albumin 2.6. PC 53.24, WBC 13.1, Hgb 8.8, Plts 77 - [place] note says COVID screening 2 wks ago.  ID: Sepsis from presumed GI tract. Immunocompromised. - Temp 99.2, WBC-IP,  Procalcitonin 42.71, LA 2.4  Zosyn/Levaquin at [place] (LD Zosyn 8/18 0117) Zosyn 8/17>> Vanco 8/18>>  Vancomycin 750 mg IV Q 12 hrs. Goal AUC 400-550. Expected AUC: 483.3 SCr used: 0.8   Plan: Vanco 1g IV x 1 then 750mg  IV q 12h Zosyn 3.375g IV q 8 hrs.   Height: 5\' 8"  (172.7 cm) Weight: 126 lb 1.7 oz (57.2 kg) IBW/kg (Calculated) : 68.4  Temp (24hrs), Avg:99 F (37.2 C), Min:98.8 F (37.1 C), Max:99.2 F (37.3 C)  Recent Labs  Lab 10/23/18 1028  CREATININE 0.73  LATICACIDVEN 2.4*    Estimated Creatinine Clearance: 71.5 mL/min (by C-G formula based on SCr of 0.73 mg/dL).    Allergies  Allergen Reactions  . [place] [Methyl Salicylate] Rash    Tommy Oconnor S. Alford Highland, PharmD, BCPS Clinical Staff Pharmacist Eilene Ghazi Stillinger 10/23/2018 12:41 PM

## 2018-10-24 ENCOUNTER — Inpatient Hospital Stay (HOSPITAL_COMMUNITY): Payer: Medicare Other

## 2018-10-24 DIAGNOSIS — E860 Dehydration: Secondary | ICD-10-CM

## 2018-10-24 DIAGNOSIS — R579 Shock, unspecified: Secondary | ICD-10-CM

## 2018-10-24 LAB — URINE CULTURE: Culture: NO GROWTH

## 2018-10-24 LAB — BASIC METABOLIC PANEL
Anion gap: 7 (ref 5–15)
BUN: 14 mg/dL (ref 8–23)
CO2: 30 mmol/L (ref 22–32)
Calcium: 7.8 mg/dL — ABNORMAL LOW (ref 8.9–10.3)
Chloride: 99 mmol/L (ref 98–111)
Creatinine, Ser: 0.55 mg/dL — ABNORMAL LOW (ref 0.61–1.24)
GFR calc Af Amer: >60 mL/min (ref >60)
GFR calc non Af Amer: >60 mL/min (ref >60)
Glucose, Bld: 98 mg/dL (ref 70–99)
Potassium: 3 mmol/L — ABNORMAL LOW (ref 3.5–5.1)
Sodium: 136 mmol/L (ref 135–145)

## 2018-10-24 LAB — HIV ANTIBODY (ROUTINE TESTING W REFLEX): HIV Screen 4th Generation wRfx: NONREACTIVE

## 2018-10-24 LAB — POCT I-STAT 7, (LYTES, BLD GAS, ICA,H+H)
Acid-Base Excess: 10 mmol/L — ABNORMAL HIGH (ref 0.0–2.0)
Bicarbonate: 33 mmol/L — ABNORMAL HIGH (ref 20.0–28.0)
Calcium, Ion: 1.18 mmol/L (ref 1.15–1.40)
HCT: 24 % — ABNORMAL LOW (ref 39.0–52.0)
Hemoglobin: 8.2 g/dL — ABNORMAL LOW (ref 13.0–17.0)
O2 Saturation: 98 %
Patient temperature: 98.6
Potassium: 2.9 mmol/L — ABNORMAL LOW (ref 3.5–5.1)
Sodium: 135 mmol/L (ref 135–145)
TCO2: 34 mmol/L — ABNORMAL HIGH (ref 22–32)
pCO2 arterial: 35.6 mmHg (ref 32.0–48.0)
pH, Arterial: 7.574 — ABNORMAL HIGH (ref 7.350–7.450)
pO2, Arterial: 84 mmHg (ref 83.0–108.0)

## 2018-10-24 LAB — HEPATIC FUNCTION PANEL
ALT: 56 U/L — ABNORMAL HIGH (ref 0–44)
AST: 74 U/L — ABNORMAL HIGH (ref 15–41)
Albumin: 1.9 g/dL — ABNORMAL LOW (ref 3.5–5.0)
Alkaline Phosphatase: 426 U/L — ABNORMAL HIGH (ref 38–126)
Bilirubin, Direct: 2.5 mg/dL — ABNORMAL HIGH (ref 0.0–0.2)
Indirect Bilirubin: 1.4 mg/dL — ABNORMAL HIGH (ref 0.3–0.9)
Total Bilirubin: 3.9 mg/dL — ABNORMAL HIGH (ref 0.3–1.2)
Total Protein: 5.1 g/dL — ABNORMAL LOW (ref 6.5–8.1)

## 2018-10-24 LAB — CBC
HCT: 23.1 % — ABNORMAL LOW (ref 39.0–52.0)
Hemoglobin: 7.8 g/dL — ABNORMAL LOW (ref 13.0–17.0)
MCH: 32.2 pg (ref 26.0–34.0)
MCHC: 33.8 g/dL (ref 30.0–36.0)
MCV: 95.5 fL (ref 80.0–100.0)
Platelets: 66 10*3/uL — ABNORMAL LOW (ref 150–400)
RBC: 2.42 MIL/uL — ABNORMAL LOW (ref 4.22–5.81)
RDW: 16.2 % — ABNORMAL HIGH (ref 11.5–15.5)
WBC: 6.9 10*3/uL (ref 4.0–10.5)
nRBC: 0 % (ref 0.0–0.2)

## 2018-10-24 LAB — PATHOLOGIST SMEAR REVIEW

## 2018-10-24 LAB — LACTIC ACID, PLASMA: Lactic Acid, Venous: 1.3 mmol/L (ref 0.5–1.9)

## 2018-10-24 MED ORDER — POTASSIUM CHLORIDE CRYS ER 20 MEQ PO TBCR
40.0000 meq | EXTENDED_RELEASE_TABLET | Freq: Once | ORAL | Status: AC
  Administered 2018-10-24: 09:00:00 40 meq via ORAL
  Filled 2018-10-24: qty 2

## 2018-10-24 MED ORDER — LACTATED RINGERS IV BOLUS
500.0000 mL | Freq: Once | INTRAVENOUS | Status: AC
  Administered 2018-10-24: 500 mL via INTRAVENOUS

## 2018-10-24 NOTE — Progress Notes (Addendum)
Patient stable.  Significant other reports a biliary stent obstruction on CT in [place].  I am unable to get records, RN arranging and accepting MD from [facility] is aware to follow up.  Transfer to med-tele and to [facility] with PCCM off 8/20.  Rush Farmer, M.D. [facility] Pulmonary/Critical Care Medicine. Pager: [phone]. After hours pager: [phone].

## 2018-10-24 NOTE — Progress Notes (Addendum)
PULMONARY / CRITICAL CARE MEDICINE   NAME:  Tommy Oconnor, MRN:  [id number], DOB:  27-Apr-1950, LOS: 1 ADMISSION DATE:  10/23/2018, REFERRING MD: [facility] emergency department, CHIEF COMPLAINT: Hypotensive in the setting of sepsis  BRIEF HISTORY:    68 year old with a history of pancreatic cancer x1 year admitted with sepsis. 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  He is completed radiation therapy.  Currently receiving chemotherapy from an oncologist in [facility].  He reported [facility] 24 hours ago with a 1 day complaint of nausea vomiting diarrhea.  Found to be hypertensive and difficulty breathing.  He underwent a paracentesis removal of 2100 cc of fluid which improved his breathing status.  Treated with empirical antimicrobial therapy with Zosyn and Levaquin.Marland Kitchen  He is a limited CODE BLUE with no intubation no shock or CPR.  Vasoactive medications are allowed. SIGNIFICANT PAST MEDICAL HISTORY   Pancreatic cancer diagnosed 1 year ago status post radiation now on currently in chemotherapy Hypertension  SIGNIFICANT EVENTS:  10/23/2018 transferred from [facility] to [place]:    CULTURES:  10/23/2018 blood cultures x2>> 10/23/2018 urine culture>> negative 10/23/2018 sputum with obtainable>> 18 2020 procalcitonin>> greater than 47 10/22/2018 [facility] blood cultures x4>> gram-negative rods  ANTIBIOTICS:  10/23/2018 vancomycin>> 10/24/2018 10/23/2018 Zosyn>>  LINES/TUBES:  Port-A-Cath unknown date>> CONSULTANTS:   SUBJECTIVE:  Awake and alert continues to remain on Levophed will attempt to wean CONSTITUTIONAL: BP 95/64   Pulse 98   Temp 99 F (37.2 C) (Oral)   Resp 13   Ht 5\' 8"  (1.727 m)   Wt 62.9 kg   SpO2 99%   BMI 21.08 kg/m   I/O last 3 completed shifts: In: 1554.6 [P.O.:420;  I.V.:512.1; Other:200; IV Piggyback:422.5] Out: 1475 [HQION:6295]        PHYSICAL EXAM: General: Frail cachectic male no acute distress HEENT: No JVD or lymphadenopathy is appreciated Neuro: Grossly intact follows commands CV: Heart sounds irregular regular rate and rhythm PULM: Decreased breath sounds in the bases GI: soft, bsx4 active nontender Extremities: warm/dry, negative edema  Skin: no rashes or lesions   RESOLVED PROBLEM LIST   ASSESSMENT AND PLAN   Sepsis from presumed GI source.  In the setting of immunocompromised male with pancreatic cancer for 1 year having had past radiation and now currently enrolled in chemotherapy.  Chemotherapy agents are unknown at this time further investigation will be continued. Bolus of 500 lactated Ringer's Wean Levophed off Procalcitonin noted to be 42.71 Reports from [facility] 4 blood cultures with gram-negative rods.  Will discontinue vancomycin continue Zosyn at this time Follow culture data Lactic acid was elevated 2.3    Pancreatic cancer Hemotherapy is currently on hold D intensify his care.  Hopefully he will return to [facility] for further chemotherapy near future  Nausea vomiting diarrhea Tolerated advance diet No further complaints of nausea vomiting or diarrhea Resolved  Electrolyte abnormality Recent Labs  Lab 10/23/18 1034 10/24/18 0319 10/24/18 0510  K 3.2* 2.9* 3.0*   Replete potassium Serial basic metabolic packages   SUMMARY OF TODAY'S PLAN:  10/24/2018 much more awake and alert.  Levophed is weaning down.  Fluid bolus of lactated Ringer's liberate from vasopressor support and transition out of intensive care and to [facility] by 8/20//2020  Best Practice / Goals of Care / Disposition.   DVT  PROPHYLAXIS: Heparin SUP: PPI NUTRITION: Clear liquid MOBILITY: Out of bed as tolerated GOALS OF CARE: Limited CODE BLUE no shock or intubation.  Vasopressors are allowed FAMILY  DISCUSSIONS: Patient updated at bedside DISPOSITION currently in intensive care unit receiving Levophed for low blood pressure  LABS  Glucose Recent Labs  Lab 10/23/18 0911 10/23/18 1239 10/23/18 1602  GLUCAP 118* 120* 93    BMET Recent Labs  Lab 10/23/18 1028 10/23/18 1034 10/24/18 0319 10/24/18 0510  NA 141 141 135 136  K 3.5 3.2* 2.9* 3.0*  CL 100  --   --  99  CO2 30  --   --  30  BUN 16  --   --  14  CREATININE 0.73  --   --  0.55*  GLUCOSE 129*  --   --  98    Liver Enzymes Recent Labs  Lab 10/23/18 1028  AST 121*  ALT 74*  ALKPHOS 535*  BILITOT 4.6*  ALBUMIN 2.6*    Electrolytes Recent Labs  Lab 10/23/18 1028 10/24/18 0510  CALCIUM 8.9 7.8*  MG 1.5*  --   PHOS 2.4*  --     CBC Recent Labs  Lab 10/23/18 1028 10/23/18 1034 10/24/18 0319 10/24/18 0510  WBC 9.3  --   --  6.9  HGB 9.8* 8.5* 8.2* 7.8*  HCT 29.7* 25.0* 24.0* 23.1*  PLT 83*  --   --  66*    ABG Recent Labs  Lab 10/23/18 1034 10/24/18 0319  PHART 7.519* 7.574*  PCO2ART 39.3 35.6  PO2ART 73.0* 84.0    Coag's Recent Labs  Lab 10/23/18 1028  APTT 39*  INR 2.0*    Sepsis Markers Recent Labs  Lab 10/23/18 1028 10/23/18 1406  LATICACIDVEN 2.4* 2.3*  PROCALCITON 42.71  --     Cardiac Enzymes No results for input(s): TROPONINI, PROBNP in the last 168 hours.  App cct 30 min  Richardson Landry Minor ACNP Maryanna Shape PCCM Pager [phone] till 1 pm If no answer page [phone] 10/24/2018, 8:32 AM  Attending Note:  68 year old male with metastatic pancreatic cancer presenting with septic shock requiring pressors that has now resolved.  Site of infection remains unknown, wonder if patient was just profoundly dehydrated.  On exam, patient is alert and interactive, moving all ext to command.  I reviewed CXR from [place], no acute disease noted.  Discussed with PCCM-NP.  Will attempt to get off pressors today.  Continue hydration.  Continue abx.  F/U on cultures.  Hold stress  dose steroids.  If able to stay off pressors by this afternoon will consider transition out of the ICU.  The patient is critically ill with multiple organ systems failure and requires high complexity decision making for assessment and support, frequent evaluation and titration of therapies, application of advanced monitoring technologies and extensive interpretation of multiple databases.   Critical Care Time devoted to patient care services described in this note is  31  Minutes. This time reflects time of care of this signee Dr Jennet Maduro. This critical care time does not reflect procedure time, or teaching time or supervisory time of PA/NP/Med student/Med Resident etc but could involve care discussion time.  Rush Farmer, M.D. [facility] Pulmonary/Critical Care Medicine. Pager: [phone]. After hours pager: [phone].

## 2018-10-25 ENCOUNTER — Inpatient Hospital Stay (HOSPITAL_COMMUNITY): Payer: Medicare Other

## 2018-10-25 ENCOUNTER — Encounter (HOSPITAL_COMMUNITY): Payer: Self-pay

## 2018-10-25 ENCOUNTER — Other Ambulatory Visit: Payer: Self-pay

## 2018-10-25 DIAGNOSIS — K8689 Other specified diseases of pancreas: Secondary | ICD-10-CM

## 2018-10-25 DIAGNOSIS — E43 Unspecified severe protein-calorie malnutrition: Secondary | ICD-10-CM

## 2018-10-25 DIAGNOSIS — C250 Malignant neoplasm of head of pancreas: Secondary | ICD-10-CM

## 2018-10-25 DIAGNOSIS — K8309 Other cholangitis: Secondary | ICD-10-CM

## 2018-10-25 LAB — CBC WITH DIFFERENTIAL/PLATELET
Abs Immature Granulocytes: 0.02 10*3/uL (ref 0.00–0.07)
Basophils Absolute: 0 10*3/uL (ref 0.0–0.1)
Basophils Relative: 0 %
Eosinophils Absolute: 0 10*3/uL (ref 0.0–0.5)
Eosinophils Relative: 0 %
HCT: 26.1 % — ABNORMAL LOW (ref 39.0–52.0)
Hemoglobin: 9 g/dL — ABNORMAL LOW (ref 13.0–17.0)
Immature Granulocytes: 0 %
Lymphocytes Relative: 7 %
Lymphs Abs: 0.3 10*3/uL — ABNORMAL LOW (ref 0.7–4.0)
MCH: 31.9 pg (ref 26.0–34.0)
MCHC: 34.5 g/dL (ref 30.0–36.0)
MCV: 92.6 fL (ref 80.0–100.0)
Monocytes Absolute: 0.5 10*3/uL (ref 0.1–1.0)
Monocytes Relative: 10 %
Neutro Abs: 4.3 10*3/uL (ref 1.7–7.7)
Neutrophils Relative %: 83 %
Platelets: 81 10*3/uL — ABNORMAL LOW (ref 150–400)
RBC: 2.82 MIL/uL — ABNORMAL LOW (ref 4.22–5.81)
RDW: 15.9 % — ABNORMAL HIGH (ref 11.5–15.5)
WBC: 5.2 10*3/uL (ref 4.0–10.5)
nRBC: 0 % (ref 0.0–0.2)

## 2018-10-25 LAB — BASIC METABOLIC PANEL
Anion gap: 9 (ref 5–15)
BUN: 11 mg/dL (ref 8–23)
CO2: 27 mmol/L (ref 22–32)
Calcium: 8 mg/dL — ABNORMAL LOW (ref 8.9–10.3)
Chloride: 98 mmol/L (ref 98–111)
Creatinine, Ser: 0.52 mg/dL — ABNORMAL LOW (ref 0.61–1.24)
GFR calc Af Amer: >60 mL/min (ref >60)
GFR calc non Af Amer: >60 mL/min (ref >60)
Glucose, Bld: 103 mg/dL — ABNORMAL HIGH (ref 70–99)
Potassium: 3.4 mmol/L — ABNORMAL LOW (ref 3.5–5.1)
Sodium: 134 mmol/L — ABNORMAL LOW (ref 135–145)

## 2018-10-25 LAB — MAGNESIUM: Magnesium: 1.5 mg/dL — ABNORMAL LOW (ref 1.7–2.4)

## 2018-10-25 LAB — PHOSPHORUS: Phosphorus: 2.4 mg/dL — ABNORMAL LOW (ref 2.5–4.6)

## 2018-10-25 MED ORDER — PANTOPRAZOLE SODIUM 40 MG PO TBEC
40.0000 mg | DELAYED_RELEASE_TABLET | Freq: Every day | ORAL | Status: DC
  Administered 2018-10-26 – 2018-11-01 (×7): 40 mg via ORAL
  Filled 2018-10-25 (×7): qty 1

## 2018-10-25 MED ORDER — OXYCODONE HCL 5 MG PO TABS
15.0000 mg | ORAL_TABLET | Freq: Four times a day (QID) | ORAL | Status: DC | PRN
  Administered 2018-10-25 – 2018-10-31 (×9): 15 mg via ORAL
  Filled 2018-10-25 (×9): qty 3

## 2018-10-25 MED ORDER — PANCRELIPASE (LIP-PROT-AMYL) 36000-114000 UNITS PO CPEP
72000.0000 [IU] | ORAL_CAPSULE | Freq: Three times a day (TID) | ORAL | Status: DC
  Filled 2018-10-25 (×2): qty 2

## 2018-10-25 MED ORDER — PANCRELIPASE (LIP-PROT-AMYL) 12000-38000 UNITS PO CPEP
72000.0000 [IU] | ORAL_CAPSULE | Freq: Three times a day (TID) | ORAL | Status: DC
  Administered 2018-10-25 – 2018-10-31 (×17): 72000 [IU] via ORAL
  Filled 2018-10-25 (×19): qty 6

## 2018-10-25 MED ORDER — OLANZAPINE 5 MG PO TABS
10.0000 mg | ORAL_TABLET | Freq: Every day | ORAL | Status: DC
  Administered 2018-10-25 – 2018-10-31 (×7): 10 mg via ORAL
  Filled 2018-10-25 (×7): qty 2

## 2018-10-25 MED ORDER — PANTOPRAZOLE SODIUM 40 MG PO TBEC
40.0000 mg | DELAYED_RELEASE_TABLET | Freq: Every day | ORAL | Status: DC
  Administered 2018-10-25: 40 mg via ORAL
  Filled 2018-10-25: qty 1

## 2018-10-25 MED ORDER — OLANZAPINE 5 MG PO TABS
5.0000 mg | ORAL_TABLET | Freq: Every day | ORAL | Status: DC | PRN
  Administered 2018-10-28: 5 mg via ORAL
  Filled 2018-10-25: qty 1

## 2018-10-25 MED ORDER — PANCRELIPASE (LIP-PROT-AMYL) 12000-38000 UNITS PO CPEP
36000.0000 [IU] | ORAL_CAPSULE | ORAL | Status: DC
  Administered 2018-10-25 – 2018-10-31 (×11): 36000 [IU] via ORAL
  Filled 2018-10-25 (×11): qty 3

## 2018-10-25 MED ORDER — ACETAMINOPHEN 160 MG/5ML PO SOLN
650.0000 mg | Freq: Four times a day (QID) | ORAL | Status: DC | PRN
  Filled 2018-10-25: qty 20.3

## 2018-10-25 MED ORDER — POTASSIUM CHLORIDE CRYS ER 20 MEQ PO TBCR
20.0000 meq | EXTENDED_RELEASE_TABLET | Freq: Every day | ORAL | Status: DC
  Administered 2018-10-25 – 2018-11-01 (×7): 20 meq via ORAL
  Filled 2018-10-25 (×7): qty 1

## 2018-10-25 MED ORDER — METHADONE HCL 10 MG PO TABS
10.0000 mg | ORAL_TABLET | Freq: Two times a day (BID) | ORAL | Status: DC
  Administered 2018-10-25 – 2018-11-01 (×14): 10 mg via ORAL
  Filled 2018-10-25 (×14): qty 1

## 2018-10-25 NOTE — Care Management Important Message (Signed)
Important Message  Patient Details  Name: Tommy Oconnor MRN: [id number] Date of Birth: May 10, 1950   Medicare Important Message Given:  Yes     Shelda Altes 10/25/2018, 1:42 PM

## 2018-10-25 NOTE — Progress Notes (Signed)
Patient's temp is 100.4. Paged cirtical care and will place order for tylenol.

## 2018-10-25 NOTE — H&P (View-Only) (Signed)
[name] Consult: 10:33 AM 10/25/2018  LOS: 2 days    Referring Provider: Dr Benny Lennert  Primary Care Physician:  [name], PA-C Primary Gastroenterologist:  Dr. Ardis Hughs      Reason for Consultation: Biliary sepsis?,  Occluded CBD stent?.  Metastatic pancreatic cancer.   HPI: Tommy Oconnor is a 68 y.o. male.  Hx Metastatic pancreatic cancer dx spring 2019. Has required 3 ERCPs.  Says he is receiving ongoing chemotherapy every Monday.  Last chemotherapy was a week ago Monday.  07/2017 EUS.  For evaluation of pancreatic mass, pancreatic ductal dilatation, abdominal pain, weight loss.  Dr. Ardis Hughs noted large mass in body of pancreas (5.1 cm per CT) causing upstream main PD dilation.  FNA cytology confirmed adenocarcinoma.  Previous CT report revealed unresectable mass due to significant involvement of SMV, SMA, celiac trunk. 10/16/2017 ERCP. Dr. Rush Landmark placed 10 x 6 mm, uncovered, metal stent placed across high-grade CBD stenosis at mid duct. Ectasia of proximal CBD and central biliary confluence.  Balloon dilation of CBD. 02/09/2018 ERCP.  Irregular, narrowing throughout CBD.  Dr. Ardis Hughs placed 7 cm, 10 F plastic stent through existing metal stent.  Plastic stent extended 5 to 10 mm beyond distal end of metal stent.   05/10/2018 ERCP. plastic stent had migrated distally.  Metal stent filled with tumor ingrowth, biologic debris.  Plastic stent removed and replaced with new, 8 cm long uncovered SEMS.   Day 2 of hospitalization, transfer from [facility].  Presented there with 24 hours of progressive abdominal pain, multiple episodes of nonbloody N/V.  Nonbloody loose stools.  Temperature 100.2.  Tachycardic at 135.  Hypotensive at 78/51.  Patient says that the abdominal pain is chronic and somewhat progressive  in the last few days, worse after eating.  OxyContin was not helping (useds prn at home). Increasing abdominal swellling.  N/V occurs when he tries to eat or drink.  Chills for several days.  Diminished urine output and darkening in the color of the urine.  Overall diminished appetite.  Because of no teeth, he sticks to a soft diet.  128#, gradual drop from 170# a year ago.  1-2 times daily brown stools, more recently these have been loose.  T bili 4.0.  Alkaline phosphatase 672.  AST/ALT 192/78. Pro calcitonin 53.  Lactic acid 4.6. Hgb 8.8, MCV 97.  WBCs 13.1.  Platelets 77.  D-dimer 3172 COVID-19 negative. Gram stain at [facility] revealed gram-negative rods blood cultures negative at 2 days. CXR with diffuse atelectasis and focal left mid lung consolidation, PNA?Marland Kitchen Repeat CXR with bibasilar atelectasis versus infiltrates, L > R low lung volumes CTAP with contrast: At [facility]. Interval increase in pancreatic mass, measuring 6.8 cm.  Previously 2.5 x 6.2 cm mass again looks to be encasing proximal celiac axis and partially encasing SMA.  New lesions in right and left lobes of liver, concerning for mets.  Progressive intrahepatic ductal dilatation.  Hyperattenuating debris seen within the stent lumen.  CT study not tailored for evaluation of stent.  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  New moderate volume intraperitoneal ascites.  Enlarging nodule left lower lobe and new nodule right middle lobe concerning for mets. Paracentesis at [facility] removing 2.1 L.  Fluid analysis described as hazy appearance.  Fluid total nucleated cells 482, neutrophils 33%.  There are no fluid results for albumin in the [place] records.  Confirmed with pathology department that fluid was not sent for cytology.  Received albumin post tap.  Treated with nor epi, levofloxacin, Zosyn   With sepsis of yet undetermined etiology, though suspect  biliary, patient transferred to [facility] service at [facility] 8/18, now on hospitalist service. Norepinephrine discontinued yesterday.  Zosyn continues. Nausea, vomiting, resolved in fact he was he able to eat some solid food for breakfast.  Mid to upper abdominal pain continues.  Incidental recent occurrence is that he slept on his right arm and cause nerve damage. Orthopedics in [place] placed an elaborate brace on the forearm/hand  Patient denies unusual bleeding or bruising.  Does not drink alcohol.  Lives in [place] with his girlfriend. Fm hx negative for pancreatic cancer.    Past Medical History:  Diagnosis Date  . Pancreatic cancer ([place])    "stage III; dx'd ~ 07/2017"    Past Surgical History:  Procedure Laterality Date  . APPENDECTOMY    . BILIARY STENT PLACEMENT  10/16/2017   Procedure: BILIARY STENT PLACEMENT;  Surgeon: Rush Landmark Telford Nab., MD;  Location: [place];  Service: Gastroenterology;;  . BILIARY STENT PLACEMENT N/A 02/09/2018   Procedure: BILIARY STENT PLACEMENT;  Surgeon: Milus Banister, MD;  Location: [facility];  Service: Endoscopy;  Laterality: N/A;  . BILIARY STENT PLACEMENT N/A 05/10/2018   Procedure: BILIARY STENT PLACEMENT;  Surgeon: Milus Banister, MD;  Location: [facility];  Service: Endoscopy;  Laterality: N/A;  . ENDOSCOPIC RETROGRADE CHOLANGIOPANCREATOGRAPHY (ERCP) WITH PROPOFOL N/A 02/09/2018   Procedure: ENDOSCOPIC RETROGRADE CHOLANGIOPANCREATOGRAPHY (ERCP) WITH PROPOFOL;  Surgeon: Milus Banister, MD;  Location: [facility];  Service: Endoscopy;  Laterality: N/A;  LFT on arrival  . ENDOSCOPIC RETROGRADE CHOLANGIOPANCREATOGRAPHY (ERCP) WITH PROPOFOL N/A 05/10/2018   Procedure: ENDOSCOPIC RETROGRADE CHOLANGIOPANCREATOGRAPHY (ERCP) WITH PROPOFOL;  Surgeon: Milus Banister, MD;  [facility]: [facility];  Service: Endoscopy;  Laterality: N/A;  . ERCP N/A 10/16/2017   Procedure: ENDOSCOPIC RETROGRADE CHOLANGIOPANCREATOGRAPHY (ERCP);  Surgeon:  Irving Copas., MD;  Location: [place];  Service: Gastroenterology;  Laterality: N/A;  . EUS N/A 08/03/2017   Procedure: UPPER ENDOSCOPIC ULTRASOUND (EUS) RADIAL;  Surgeon: Milus Banister, MD;  Location: [facility];  Service: Endoscopy;  Laterality: N/A;  . FINE NEEDLE ASPIRATION N/A 08/03/2017   Procedure: FINE NEEDLE ASPIRATION (FNA) LINEAR;  Surgeon: Milus Banister, MD;  Location: [facility];  Service: Endoscopy;  Laterality: N/A;  . SPHINCTEROTOMY  10/16/2017   Procedure: SPHINCTEROTOMY;  Surgeon: Rush Landmark Telford Nab., MD;  Location: [place];  Service: Gastroenterology;;  . Lavell Islam REMOVAL  05/10/2018   Procedure: STENT REMOVAL;  Surgeon: Milus Banister, MD;  Location: [facility];  Service: Endoscopy;;  balloon sweep    Prior to Admission medications   Medication Sig Start Date End Date Taking? Authorizing Provider  CREON 36000 units CPEP capsule Take 20,100-71,219 Units by mouth See admin instructions. Take 2 capsules (72,000 units) three times daily with meals & take 1 capsule (36,000 units) by mouth twice daily with snacks. 08/03/17  Yes [provider]  furosemide (LASIX) 20 MG tablet Take 20 mg by mouth daily. 09/24/18  Yes [provider]  metFORMIN (GLUCOPHAGE) 500 MG tablet Take 500 mg by mouth 2 (two) times daily with a meal.    Yes [provider]  methadone (DOLOPHINE) 10 MG tablet Take 10 mg by mouth 2 (two) times daily.    Yes [provider]  OLANZapine (ZYPREXA) 10 MG tablet Take 5-10 mg by mouth at bedtime. Take 1 tablet (10 mg) by mouth scheduled each night at bedtime; may take 0.5 tablet (5 mg) by mouth in the morning if experiencing severe nausea. 09/20/17  Yes [provider]  omeprazole (PRILOSEC) 20 MG capsule Take 20 mg by mouth 2 (two) times daily before a meal.  07/06/17  Yes [provider]  oxyCODONE (ROXICODONE) 15 MG immediate release tablet Take 15 mg by mouth 4 (four) times daily as  needed for severe pain. 10/11/18  Yes [provider]  potassium chloride SA (K-DUR) 20 MEQ tablet Take 20 mEq by mouth daily. 09/03/18  Yes [provider]    Scheduled Meds: . heparin  5,000 Units Subcutaneous Q8H  . pantoprazole (PROTONIX) IV  40 mg Intravenous QHS   Infusions: . sodium chloride Stopped (10/23/18 1559)  . lactated ringers    . piperacillin-tazobactam (ZOSYN)  IV 3.375 g (10/25/18 0350)   PRN Meds: acetaminophen (TYLENOL) oral liquid 160 mg/5 mL, fentaNYL (SUBLIMAZE) injection   Allergies as of 10/23/2018 - Review Complete 10/23/2018  Allergen Reaction Noted  . [place] [methyl salicylate] Rash 66/44/0347    Family History  Problem Relation Age of Onset  . Diabetes Brother     Social History   Socioeconomic History  . Marital status: Divorced    Spouse name: Not on file  . Number of children: Not on file  . Years of education: Not on file  . Highest education level: Not on file  Occupational History  . Not on file  Social Needs  . Financial resource strain: Not on file  . Food insecurity    Worry: Not on file    Inability: Not on file  . Transportation needs    Medical: Not on file    Non-medical: Not on file  Tobacco Use  . Smoking status: Never Smoker  . Smokeless tobacco: Never Used  Substance and Sexual Activity  . Alcohol use: Never    Frequency: Never  . Drug use: Not Currently    Comment: 10/13/2017 "into drugs as a teenager; for ~ 1 yr"  . Sexual activity: Not Currently  Lifestyle  . Physical activity    Days per week: Not on file    Minutes per session: Not on file  . Stress: Not on file  Relationships  . Social Herbalist on phone: Not on file    Gets together: Not on file    Attends religious service: Not on file    Active member of club or organization: Not on file    Attends meetings of clubs or organizations: Not on file    Relationship status: Not on file  . Intimate partner violence     Fear of current or ex partner: Not on file    Emotionally abused: Not on file    Physically abused: Not on file    Forced sexual activity: Not on file  Other Topics Concern  . Not on file  Social History Narrative  . Not on file    REVIEW OF SYSTEMS: Constitutional: Weakness in general. ENT:  No nose bleeds Pulm: Short of breath.  Occasional productive cough. CV:  No palpitations, no LE edema.  Denies angina GU: Dark urine, resolved oliguria since receiving IV fluids. GI: See HPI Heme: Denies unusual or excessive bleeding or bruising. Transfusions: No prior blood product transfusions. Neuro:  No headaches, no peripheral tingling or numbness.  Seizures, no syncope. Derm:  No itching, no rash or sores.  Endocrine:  No sweats or chills.  No polyuria or dysuria Immunization: Not queried Travel:  None beyond local counties in last few months.    PHYSICAL EXAM: Vital signs in last 24 hours: Vitals:   10/25/18 0520 10/25/18 0846  BP: 108/78 112/82  Pulse: (!) 108 (!) 125  Resp: 20 18  Temp: 98.7 F (37.1 C) 98.9 F (37.2 C)  SpO2: 96% 97%   Wt Readings from Last 3 Encounters:  10/25/18 61.8 kg  05/10/18 61.2 kg  02/09/18 63.5 kg    General: Chronically ill, thin WM who is alert and able to provide history.  Sitting in bed, comfortable.  Does not look toxic. Head: No facial asymmetry or swelling.  No signs of head trauma. Eyes: No scleral icterus.  Conjunctiva is pale.  EOMI. Ears: Slight HOH Nose: No congestion or discharge Mouth: Oral mucosa pink, moist, clear.  Tongue midline.  Edentulous. Neck: No JVD, no masses, no thyromegaly. Lungs: Crackles in the bases worse on the right.  Generally diminished breath sounds.  No labored breathing.  No cough. Heart: tachy in the low 100s, regular.  No MRG.  S1, S2 audible. Abdomen: Soft.  Slightly distended.  Fullness in right upper quadrant with feels like a soft edged mass about the size of a golf ball.  There is tenderness in  the right upper quadrant and across into the epigastrium, slightly in the left upper quadrant.  Lower quadrants not tender.  Bowel sounds active..   Rectal: Deferred Musc/Skeltl: No joint redness, swelling or gross deformity. Extremities: No CCE. Neurologic: Oriented x3.  Provides adequate history.  Moves all 4 limbs.  No tremor, no gross weakness or deficits. Skin: No rash, no suspicious lesions, no sores.  No significant purpura or bruising   Psych: Calm, pleasant, cooperative.  Intake/Output from previous day: 08/19 0701 - 08/20 0700 In: 1331.8 [P.O.:400; I.V.:858.7; IV Piggyback:73.1] Out: 500 [Urine:500] Intake/Output this shift: Total I/O In: 120 [P.O.:120] Out: -   LAB RESULTS: Recent Labs    10/23/18 1028  10/24/18 0319 10/24/18 0510 10/25/18 0635  WBC 9.3  --   --  6.9 5.2  HGB 9.8*   < > 8.2* 7.8* 9.0*  HCT 29.7*   < > 24.0* 23.1* 26.1*  PLT 83*  --   --  66* 81*   < > = values in this interval not displayed.   BMET Lab Results  Component Value Date   NA 134 (L) 10/25/2018   NA 136 10/24/2018   NA 135 10/24/2018   K 3.4 (L) 10/25/2018   K 3.0 (L) 10/24/2018   K 2.9 (L) 10/24/2018   CL 98 10/25/2018   CL 99 10/24/2018   CL 100 10/23/2018   CO2 27 10/25/2018   CO2 30 10/24/2018   CO2 30 10/23/2018   GLUCOSE 103 (H) 10/25/2018   GLUCOSE 98 10/24/2018   GLUCOSE 129 (H) 10/23/2018   BUN 11 10/25/2018   BUN 14 10/24/2018   BUN 16 10/23/2018   CREATININE 0.52 (L) 10/25/2018   CREATININE 0.55 (L) 10/24/2018   CREATININE 0.73 10/23/2018   CALCIUM 8.0 (L) 10/25/2018   CALCIUM 7.8 (L) 10/24/2018  CALCIUM 8.9 10/23/2018   LFT Recent Labs    10/23/18 1028 10/24/18 0853  PROT 5.9* 5.1*  ALBUMIN 2.6* 1.9*  AST 121* 74*  ALT 74* 56*  ALKPHOS 535* 426*  BILITOT 4.6* 3.9*  BILIDIR  --  2.5*  IBILI  --  1.4*   PT/INR Lab Results  Component Value Date   INR 2.0 (H) 10/23/2018   INR 1.49 02/09/2018   INR 1.09 10/13/2017   Lipase     Component  Value Date/Time   LIPASE 18 10/23/2018 1028    Drugs of Abuse  No results found for: LABOPIA, COCAINSCRNUR, LABBENZ, AMPHETMU, THCU, LABBARB   RADIOLOGY STUDIES: Dg Chest Port 1 View  Result Date: 10/24/2018 CLINICAL DATA:  Shortness of breath, history pancreatic cancer EXAM: PORTABLE CHEST 1 VIEW COMPARISON:  Portable exam 0946 hours compared to 10/22/2018 FINDINGS: LEFT subclavian Port-A-Cath with tip projecting over SVC. Normal heart size, mediastinal contours, and pulmonary vascularity. Low lung volumes with increased bibasilar opacities question atelectasis versus infiltrate. Minimal central peribronchial thickening. Skin folds project over LEFT chest. No pleural effusion or pneumothorax. Old healed fracture mid LEFT clavicle. CBD stent RIGHT upper quadrant. IMPRESSION: Decreased lung volumes with increased bibasilar opacities question atelectasis versus infiltrate. Electronically Signed   By: Lavonia Dana M.D.   On: 10/24/2018 09:57     IMPRESSION:   *    Sepsis in patient with what looks like an occluded (reoccluded) biliary stent. Gram-negative rods on stain at [place] not have culture results from there, blood cultures at [place] without growth so far.  Day 4 Zosyn.  *    Metastatic pancreatic cancer with new liver mets, evolving lung mets.  Previous ERCPs with stenting, most recently 05/2018 uncovered metal stent placed within previous metal stent.  *    Ascites.  2.1 L paracentesis at [facility].  WBCs 42, neutrophils 33%/159 total count.  Does not meet criteria for SBP.  Unfortunately fluid not sent for albumin or cytology.  Given clinical picture, could have malignant ascites.  *    Normocytic anemia.  Hgb trend with in his baseline    PLAN:     *    ERCP orders placed in the [place], will formally schedule this after Dr. Henrene Pastor rounds and confirms procedure.     Azucena Freed  10/25/2018, 10:33 AM Phone [phone]  GI ATTENDING  History,  laboratories, x-rays, prior ERCP and EUS reports personally reviewed.  Pathology reviewed.  Patient personally seen and examined.  Agree with comprehensive consultation note as outlined above.  Unfortunate 68 year old gentleman with progressive and metastatic pancreatic cancer.  Has had previous biliary stent occlusion.  Appears to have the same at this time with cholangitis.  He has stabilized since admission on antibiotics.  Progressive disease on CT imaging.  Plans for ERCP tomorrow with stent debridement and/or replacement.  The patient is high risk given his comorbidities and frail state due to advanced cancer. The nature of the procedure, as well as the risks, benefits, and alternatives were carefully and thoroughly reviewed with the patient. Ample time for discussion and questions allowed. The patient understood, was satisfied, and agreed to proceed. It is conceivable that we may not be able to access the ampullary region due to progressive growth in the region of the pancreas.  This may cause duodenal obstruction.  In any event, we will try.  If unsuccessful, percutaneous drainage would be the next step.  Docia Chuck. Geri Seminole., M.D.  [facility]

## 2018-10-25 NOTE — Progress Notes (Addendum)
PROGRESS NOTE  Tommy Oconnor NIO:[id number] DOB: 02-09-1951 DOA: 10/23/2018 PCP: [name], PA-C  Brief History   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  He is completed radiation therapy.  Currently receiving chemotherapy from an oncologist in [facility].  He reported [facility] 24 hours ago with a 1 day complaint of nausea vomiting diarrhea.  Found to be hypertensive and difficulty breathing.  He underwent a paracentesis removal of 2100 cc of fluid which improved his breathing status.  Treated with empirical antimicrobial therapy with Zosyn and Levaquin.Marland Kitchen  He is a limited CODE BLUE with no intubation no shock or CPR.  Vasoactive medications are allowed.  The patietn was transferred to the ICU at [facility] to the care of PCCM. The patient required pressor support over the course of the last 24 hours. He was transferred to the floor to the care of [facility] on 10/24/2018.  I have discussed this patient with his oncologist Lavera Guise, MD. He tells me that he feels that the patient is doing poorly with his treatment plan. He has just completed 2 cycles of gemcitabine and Abraxane which is palliative treatment following his completion of his radiation therapy. The most recent CT abdomen 10/12/2018 demonstrates dilatation of the biliary duct. It also demonstrated increase in size of the pancreatic mass. There are also new hepatic lesions that are concerning for metastasis. The patient has no prior history of  hepatic mets as per Dr. Bobby Rumpf.   Consultants  . Gastroenterology  Procedures  Paracentesis with removal of 2.1 L at [facility] on 08/23/2018.   Antibiotics   Anti-infectives (From admission, onward)   Start     Dose/Rate Route Frequency Ordered Stop   10/23/18 2245  vancomycin (VANCOCIN) IVPB 750 mg/150 ml premix  Status:  Discontinued     750  mg 150 mL/hr over 60 Minutes Intravenous Every 12 hours 10/23/18 1245 10/24/18 0842   10/23/18 1945  piperacillin-tazobactam (ZOSYN) IVPB 3.375 g     3.375 g 12.5 mL/hr over 240 Minutes Intravenous Every 8 hours 10/23/18 1245     10/23/18 1015  piperacillin-tazobactam (ZOSYN) IVPB 3.375 g     3.375 g 100 mL/hr over 30 Minutes Intravenous NOW 10/23/18 1013 10/23/18 1212   10/23/18 1015  vancomycin (VANCOCIN) IVPB 1000 mg/200 mL premix     1,000 mg 200 mL/hr over 60 Minutes Intravenous NOW 10/23/18 1013 10/23/18 1144    .   Subjective  The patient is cachectic and frail appearing. He is awake, alert, and oriented x 3. No acute distress.  Objective   Vitals:  Vitals:   10/25/18 1201 10/25/18 1705  BP: 108/80 104/78  Pulse: (!) 118 (!) 105  Resp: 18 18  Temp: 99.4 F (37.4 C) 99.1 F (37.3 C)  SpO2: 95% 97%    Exam:  Constitutional:  . The patient appears frail and chronically ill. He is awake, alert, and oriented x 3. No acute distress. Respiratory:  . No increased work of breathing. . No wheezes, rales, or rhonchi. . No tactile fremitus. Cardiovascular:  . Regular rate and rhythm. . No murmurs, ectopy, or gallups are appreciated. . No lateral PMI. No thrills. Abdomen:  . Abdomen is soft, non-tender, somewhat distended. . No hernias, masses, or organomegaly. . Hypoactive bowel sounds. Musculoskeletal:  . No cyanosis, clubbing, or edema Skin:  . No rashes, lesions, ulcers . palpation of  skin: no induration or nodules Neurologic:  . CN 2-12 intact . Sensation all 4 extremities intact Psychiatric:  . Mental status o Mood, affect appropriate o Orientation to person, place, time  . judgment and insight appear intact   I have personally reviewed the following:   Today's Data  . ABG, CMP, Lactic Acid, CBC, Vitals   Micro Data  . 1/4 BC from [facility] has gram stain positive for GNR. Blood cultures at [facility] have had no growth.  Scheduled Meds: .  heparin  5,000 Units Subcutaneous Q8H  . lipase/protease/amylase  36,000 Units Oral 2 times per day  . lipase/protease/amylase  72,000 Units Oral TID WC  . methadone  10 mg Oral BID  . OLANZapine  10 mg Oral QHS  . [START ON 10/26/2018] pantoprazole  40 mg Oral Q0600  . potassium chloride SA  20 mEq Oral Daily   Continuous Infusions: . sodium chloride 30 mL (10/25/18 1305)  . lactated ringers 75 mL/hr at 10/25/18 1422  . piperacillin-tazobactam (ZOSYN)  IV 3.375 g (10/25/18 1308)    Active Problems:   Sepsis ([place])   LOS: 2 days   A & P   Sepsis from presumed GI source.  In the setting of immunocompromised male with pancreatic cancer for 1 year having had past radiation and now currently enrolled in chemotherapy. He has completed 2 cycles of palliative chemotherapy comprised of Abraxane and gemcitabine. The patient was admitted to the ICU to the care of PCCM as he required pressor support. He was transferred to [facility] on 10/24/2018. 1/4 blood cultures from [facility] had gram stain positive for GNR. No growth from any cultures, and no growth from blood cultures drawn at [facility]. The patient receiving Vancomycin and Zosyn at [facility]. Vancomycin has since been discontinued. Lactic acid has come down to 1.3 from 2.3.  Pancreatic cancer: Now with apparent liver mets and increase in the size of the pancreatic mass per CT abdomen performed on 10/12/2018. He has just completed 2 cycles of gemcitabine and Abraxane which is palliative treatment following his completion of his radiation therapy. Dr. Bobby Rumpf feels that the patient is on a downward trajectory.  Obstructive jaundice: Concern for obstruction of biliary stent resulting in elelvated LFT's and patient's sepsis. GI has been consulted.   Nausea/vomiting/diarrhea: Advance diet as tolerated. No further diarrhea per patient.   Failure to thrive: Consult nutrition to maximize the patient's nutritional intake.   Hypokalemia: Due to GI losses. Supplement and monitor.   Anemia of chronic disease/neoplasm: Monitor.  I have seen and examined this patient myself. I have spent 45 minutes in his evaluation and care. More than 50% of this was spent in coordination of care with GI and Dr. Lavera Guise.    DVT PROPHYLAXIS: Heparin CODE STATUS: Limited CODE BLUE no shock or intubation.  Vasopressors are allowed FAMILY DISCUSSIONS: No family available. DISPOSITION tbd. Jahkeem Kurka, DO [facility] Direct contact: see [email or web address]  7PM-7AM contact night coverage as above 10/25/2018, 5:56 PM  LOS: 2 days

## 2018-10-25 NOTE — Progress Notes (Signed)
MEWS score is a 3 but this is not a change for the pt. Patient's BP has been soft and HR has been sinus tach. Will continue to monitor.

## 2018-10-25 NOTE — Progress Notes (Signed)
eLink Physician-Brief Progress Note Patient Name: Tommy Oconnor DOB: 1950/03/14 MRN: [id number]   Date of Service  10/25/2018  HPI/Events of Note  Pt requires prn Tylenol for analgesia and fever.  eICU Interventions  Tylenol order placed.        [name] 10/25/2018, 12:51 AM

## 2018-10-25 NOTE — Consult Note (Addendum)
                                                                           [facility] Consult: 10:33 AM 10/25/2018  LOS: 2 days    Referring Provider: Dr Swayze  Primary Care Physician:  O'Buch, Greta, PA-C Primary Gastroenterologist:  Dr. Jacobs      Reason for Consultation: Biliary sepsis?,  Occluded CBD stent?.  Metastatic pancreatic cancer.   HPI: Tommy Oconnor is a 68 y.o. male.  Hx Metastatic pancreatic cancer dx spring 2019. Has required 3 ERCPs.  Says he is receiving ongoing chemotherapy every Monday.  Last chemotherapy was a week ago Monday.  07/2017 EUS.  For evaluation of pancreatic mass, pancreatic ductal dilatation, abdominal pain, weight loss.  Dr. Jacobs noted large mass in body of pancreas (5.1 cm per CT) causing upstream main PD dilation.  FNA cytology confirmed adenocarcinoma.  Previous CT report revealed unresectable mass due to significant involvement of SMV, SMA, celiac trunk. 10/16/2017 ERCP. Dr. Mansouraty placed 10 x 6 mm, uncovered, metal stent placed across high-grade CBD stenosis at mid duct. Ectasia of proximal CBD and central biliary confluence.  Balloon dilation of CBD. 02/09/2018 ERCP.  Irregular, narrowing throughout CBD.  Dr. Jacobs placed 7 cm, 10 F plastic stent through existing metal stent.  Plastic stent extended 5 to 10 mm beyond distal end of metal stent.   05/10/2018 ERCP. plastic stent had migrated distally.  Metal stent filled with tumor ingrowth, biologic debris.  Plastic stent removed and replaced with new, 8 cm long uncovered SEMS.   Day 2 of hospitalization, transfer from [facility].  Presented there with 24 hours of progressive abdominal pain, multiple episodes of nonbloody N/V.  Nonbloody loose stools.  Temperature 100.2.  Tachycardic at 135.  Hypotensive at 78/51.  Patient says that the abdominal pain is chronic and somewhat progressive  in the last few days, worse after eating.  OxyContin was not helping (useds prn at home). Increasing abdominal swellling.  N/V occurs when he tries to eat or drink.  Chills for several days.  Diminished urine output and darkening in the color of the urine.  Overall diminished appetite.  Because of no teeth, he sticks to a soft diet.  128#, gradual drop from 170# a year ago.  1-2 times daily brown stools, more recently these have been loose.  T bili 4.0.  Alkaline phosphatase 672.  AST/ALT 192/78. Pro calcitonin 53.  Lactic acid 4.6. Hgb 8.8, MCV 97.  WBCs 13.1.  Platelets 77.  D-dimer 3172 COVID-19 negative. Gram stain at [facility] revealed gram-negative rods blood cultures negative at 2 days. CXR with diffuse atelectasis and focal left mid lung consolidation, PNA?. Repeat CXR with bibasilar atelectasis versus infiltrates, L > R low lung volumes CTAP with contrast: At [facility]. Interval increase in pancreatic mass, measuring 6.8 cm.  Previously 2.5 x 6.2 cm mass again looks to be encasing proximal celiac axis and partially encasing SMA.  New lesions in right and left lobes of liver, concerning for mets.  Progressive intrahepatic ductal dilatation.  Hyperattenuating debris seen within the stent lumen.  CT study not tailored for evaluation of stent.  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  New moderate volume intraperitoneal ascites.  Enlarging nodule left lower lobe and new nodule right middle lobe concerning for mets. Paracentesis at [facility] removing 2.1 L.  Fluid analysis described as hazy appearance.  Fluid total nucleated cells 482, neutrophils 33%.  There are no fluid results for albumin in the [facility] records.  Confirmed with pathology department that fluid was not sent for cytology.  Received albumin post tap.  Treated with nor epi, levofloxacin, Zosyn   With sepsis of yet undetermined etiology, though suspect  biliary, patient transferred to CCM service at [facility] 8/18, now on hospitalist service. Norepinephrine discontinued yesterday.  Zosyn continues. Nausea, vomiting, resolved in fact he was he able to eat some solid food for breakfast.  Mid to upper abdominal pain continues.  Incidental recent occurrence is that he slept on his right arm and cause nerve damage. Orthopedics in [place] placed an elaborate brace on the forearm/hand  Patient denies unusual bleeding or bruising.  Does not drink alcohol.  Lives in [place] with his girlfriend. Fm hx negative for pancreatic cancer.    Past Medical History:  Diagnosis Date  . Pancreatic cancer (HCC)    "stage III; dx'd ~ 07/2017"    Past Surgical History:  Procedure Laterality Date  . APPENDECTOMY    . BILIARY STENT PLACEMENT  10/16/2017   Procedure: BILIARY STENT PLACEMENT;  Surgeon: Mansouraty, Gabriel Jr., MD;  Location: [facility];  Service: Gastroenterology;;  . BILIARY STENT PLACEMENT N/A 02/09/2018   Procedure: BILIARY STENT PLACEMENT;  Surgeon: Jacobs, Daniel P, MD;  Location: [facility];  Service: Endoscopy;  Laterality: N/A;  . BILIARY STENT PLACEMENT N/A 05/10/2018   Procedure: BILIARY STENT PLACEMENT;  Surgeon: Jacobs, Daniel P, MD;  Location: [facility];  Service: Endoscopy;  Laterality: N/A;  . ENDOSCOPIC RETROGRADE CHOLANGIOPANCREATOGRAPHY (ERCP) WITH PROPOFOL N/A 02/09/2018   Procedure: ENDOSCOPIC RETROGRADE CHOLANGIOPANCREATOGRAPHY (ERCP) WITH PROPOFOL;  Surgeon: Jacobs, Daniel P, MD;  Location: [facility];  Service: Endoscopy;  Laterality: N/A;  LFT on arrival  . ENDOSCOPIC RETROGRADE CHOLANGIOPANCREATOGRAPHY (ERCP) WITH PROPOFOL N/A 05/10/2018   Procedure: ENDOSCOPIC RETROGRADE CHOLANGIOPANCREATOGRAPHY (ERCP) WITH PROPOFOL;  Surgeon: Jacobs, Daniel P, MD;  Location: [facility];  Service: Endoscopy;  Laterality: N/A;  . ERCP N/A 10/16/2017   Procedure: ENDOSCOPIC RETROGRADE CHOLANGIOPANCREATOGRAPHY (ERCP);  Surgeon:  Mansouraty, Gabriel Jr., MD;  Location: [facility];  Service: Gastroenterology;  Laterality: N/A;  . EUS N/A 08/03/2017   Procedure: UPPER ENDOSCOPIC ULTRASOUND (EUS) RADIAL;  Surgeon: Jacobs, Daniel P, MD;  Location: [facility];  Service: Endoscopy;  Laterality: N/A;  . FINE NEEDLE ASPIRATION N/A 08/03/2017   Procedure: FINE NEEDLE ASPIRATION (FNA) LINEAR;  Surgeon: Jacobs, Daniel P, MD;  Location: [facility];  Service: Endoscopy;  Laterality: N/A;  . SPHINCTEROTOMY  10/16/2017   Procedure: SPHINCTEROTOMY;  Surgeon: Mansouraty, Gabriel Jr., MD;  Location: [facility];  Service: Gastroenterology;;  . STENT REMOVAL  05/10/2018   Procedure: STENT REMOVAL;  Surgeon: Jacobs, Daniel P, MD;  Location: [facility];  Service: Endoscopy;;  balloon sweep    Prior to Admission medications   Medication Sig Start Date End Date Taking? Authorizing Provider  CREON 36000 units CPEP capsule Take 36,000-72,000 Units by mouth See admin instructions. Take 2 capsules (72,000 units) three times daily with meals & take 1 capsule (36,000 units) by mouth twice daily with snacks. 08/03/17  Yes [provider]  furosemide (LASIX) 20 MG tablet Take 20 mg by mouth daily. 09/24/18  Yes [provider]    metFORMIN (GLUCOPHAGE) 500 MG tablet Take 500 mg by mouth 2 (two) times daily with a meal.    Yes [provider]  methadone (DOLOPHINE) 10 MG tablet Take 10 mg by mouth 2 (two) times daily.    Yes [provider]  OLANZapine (ZYPREXA) 10 MG tablet Take 5-10 mg by mouth at bedtime. Take 1 tablet (10 mg) by mouth scheduled each night at bedtime; may take 0.5 tablet (5 mg) by mouth in the morning if experiencing severe nausea. 09/20/17  Yes [provider]  omeprazole (PRILOSEC) 20 MG capsule Take 20 mg by mouth 2 (two) times daily before a meal.  07/06/17  Yes [provider]  oxyCODONE (ROXICODONE) 15 MG immediate release tablet Take 15 mg by mouth 4 (four) times daily as  needed for severe pain. 10/11/18  Yes [provider]  potassium chloride SA (K-DUR) 20 MEQ tablet Take 20 mEq by mouth daily. 09/03/18  Yes [provider]    Scheduled Meds: . heparin  5,000 Units Subcutaneous Q8H  . pantoprazole (PROTONIX) IV  40 mg Intravenous QHS   Infusions: . sodium chloride Stopped (10/23/18 1559)  . lactated ringers    . piperacillin-tazobactam (ZOSYN)  IV 3.375 g (10/25/18 0350)   PRN Meds: acetaminophen (TYLENOL) oral liquid 160 mg/5 mL, fentaNYL (SUBLIMAZE) injection   Allergies as of 10/23/2018 - Review Complete 10/23/2018  Allergen Reaction Noted  . [place] [methyl salicylate] Rash 10/23/2018    Family History  Problem Relation Age of Onset  . Diabetes Brother     Social History   Socioeconomic History  . Marital status: Divorced    Spouse name: Not on file  . Number of children: Not on file  . Years of education: Not on file  . Highest education level: Not on file  Occupational History  . Not on file  Social Needs  . Financial resource strain: Not on file  . Food insecurity    Worry: Not on file    Inability: Not on file  . Transportation needs    Medical: Not on file    Non-medical: Not on file  Tobacco Use  . Smoking status: Never Smoker  . Smokeless tobacco: Never Used  Substance and Sexual Activity  . Alcohol use: Never    Frequency: Never  . Drug use: Not Currently    Comment: 10/13/2017 "into drugs as a teenager; for ~ 1 yr"  . Sexual activity: Not Currently  Lifestyle  . Physical activity    Days per week: Not on file    Minutes per session: Not on file  . Stress: Not on file  Relationships  . Social connections    Talks on phone: Not on file    Gets together: Not on file    Attends religious service: Not on file    Active member of club or organization: Not on file    Attends meetings of clubs or organizations: Not on file    Relationship status: Not on file  . Intimate partner violence     Fear of current or ex partner: Not on file    Emotionally abused: Not on file    Physically abused: Not on file    Forced sexual activity: Not on file  Other Topics Concern  . Not on file  Social History Narrative  . Not on file    REVIEW OF SYSTEMS: Constitutional: Weakness in general. ENT:  No nose bleeds Pulm: Short of breath.  Occasional productive cough. CV:    No palpitations, no LE edema.  Denies angina GU: Dark urine, resolved oliguria since receiving IV fluids. GI: See HPI Heme: Denies unusual or excessive bleeding or bruising. Transfusions: No prior blood product transfusions. Neuro:  No headaches, no peripheral tingling or numbness.  Seizures, no syncope. Derm:  No itching, no rash or sores.  Endocrine:  No sweats or chills.  No polyuria or dysuria Immunization: Not queried Travel:  None beyond local counties in last few months.    PHYSICAL EXAM: Vital signs in last 24 hours: Vitals:   10/25/18 0520 10/25/18 0846  BP: 108/78 112/82  Pulse: (!) 108 (!) 125  Resp: 20 18  Temp: 98.7 F (37.1 C) 98.9 F (37.2 C)  SpO2: 96% 97%   Wt Readings from Last 3 Encounters:  10/25/18 61.8 kg  05/10/18 61.2 kg  02/09/18 63.5 kg    General: Chronically ill, thin WM who is alert and able to provide history.  Sitting in bed, comfortable.  Does not look toxic. Head: No facial asymmetry or swelling.  No signs of head trauma. Eyes: No scleral icterus.  Conjunctiva is pale.  EOMI. Ears: Slight HOH Nose: No congestion or discharge Mouth: Oral mucosa pink, moist, clear.  Tongue midline.  Edentulous. Neck: No JVD, no masses, no thyromegaly. Lungs: Crackles in the bases worse on the right.  Generally diminished breath sounds.  No labored breathing.  No cough. Heart: tachy in the low 100s, regular.  No MRG.  S1, S2 audible. Abdomen: Soft.  Slightly distended.  Fullness in right upper quadrant with feels like a soft edged mass about the size of a golf ball.  There is tenderness in  the right upper quadrant and across into the epigastrium, slightly in the left upper quadrant.  Lower quadrants not tender.  Bowel sounds active..   Rectal: Deferred Musc/Skeltl: No joint redness, swelling or gross deformity. Extremities: No CCE. Neurologic: Oriented x3.  Provides adequate history.  Moves all 4 limbs.  No tremor, no gross weakness or deficits. Skin: No rash, no suspicious lesions, no sores.  No significant purpura or bruising   Psych: Calm, pleasant, cooperative.  Intake/Output from previous day: 08/19 0701 - 08/20 0700 In: 1331.8 [P.O.:400; I.V.:858.7; IV Piggyback:73.1] Out: 500 [Urine:500] Intake/Output this shift: Total I/O In: 120 [P.O.:120] Out: -   LAB RESULTS: Recent Labs    10/23/18 1028  10/24/18 0319 10/24/18 0510 10/25/18 0635  WBC 9.3  --   --  6.9 5.2  HGB 9.8*   < > 8.2* 7.8* 9.0*  HCT 29.7*   < > 24.0* 23.1* 26.1*  PLT 83*  --   --  66* 81*   < > = values in this interval not displayed.   BMET Lab Results  Component Value Date   NA 134 (L) 10/25/2018   NA 136 10/24/2018   NA 135 10/24/2018   K 3.4 (L) 10/25/2018   K 3.0 (L) 10/24/2018   K 2.9 (L) 10/24/2018   CL 98 10/25/2018   CL 99 10/24/2018   CL 100 10/23/2018   CO2 27 10/25/2018   CO2 30 10/24/2018   CO2 30 10/23/2018   GLUCOSE 103 (H) 10/25/2018   GLUCOSE 98 10/24/2018   GLUCOSE 129 (H) 10/23/2018   BUN 11 10/25/2018   BUN 14 10/24/2018   BUN 16 10/23/2018   CREATININE 0.52 (L) 10/25/2018   CREATININE 0.55 (L) 10/24/2018   CREATININE 0.73 10/23/2018   CALCIUM 8.0 (L) 10/25/2018   CALCIUM 7.8 (L) 10/24/2018     CALCIUM 8.9 10/23/2018   LFT Recent Labs    10/23/18 1028 10/24/18 0853  PROT 5.9* 5.1*  ALBUMIN 2.6* 1.9*  AST 121* 74*  ALT 74* 56*  ALKPHOS 535* 426*  BILITOT 4.6* 3.9*  BILIDIR  --  2.5*  IBILI  --  1.4*   PT/INR Lab Results  Component Value Date   INR 2.0 (H) 10/23/2018   INR 1.49 02/09/2018   INR 1.09 10/13/2017   Lipase     Component  Value Date/Time   LIPASE 18 10/23/2018 1028    Drugs of Abuse  No results found for: LABOPIA, COCAINSCRNUR, LABBENZ, AMPHETMU, THCU, LABBARB   RADIOLOGY STUDIES: Dg Chest Port 1 View  Result Date: 10/24/2018 CLINICAL DATA:  Shortness of breath, history pancreatic cancer EXAM: PORTABLE CHEST 1 VIEW COMPARISON:  Portable exam 0946 hours compared to 10/22/2018 FINDINGS: LEFT subclavian Port-A-Cath with tip projecting over SVC. Normal heart size, mediastinal contours, and pulmonary vascularity. Low lung volumes with increased bibasilar opacities question atelectasis versus infiltrate. Minimal central peribronchial thickening. Skin folds project over LEFT chest. No pleural effusion or pneumothorax. Old healed fracture mid LEFT clavicle. CBD stent RIGHT upper quadrant. IMPRESSION: Decreased lung volumes with increased bibasilar opacities question atelectasis versus infiltrate. Electronically Signed   By: Mark  Boles M.D.   On: 10/24/2018 09:57     IMPRESSION:   *    Sepsis in patient with what looks like an occluded (reoccluded) biliary stent. Gram-negative rods on stain at [facility].  Do not have culture results from there, blood cultures at [facility] without growth so far.  Day 4 Zosyn.  *    Metastatic pancreatic cancer with new liver mets, evolving lung mets.  Previous ERCPs with stenting, most recently 05/2018 uncovered metal stent placed within previous metal stent.  *    Ascites.  2.1 L paracentesis at [facility].  WBCs 42, neutrophils 33%/159 total count.  Does not meet criteria for SBP.  Unfortunately fluid not sent for albumin or cytology.  Given clinical picture, could have malignant ascites.  *    Normocytic anemia.  Hgb trend with in his baseline    PLAN:     *    ERCP orders placed in the [place], will formally schedule this after Dr. Yong Wahlquist rounds and confirms procedure.     Sarah Gribbin  10/25/2018, 10:33 AM Phone [phone]  GI ATTENDING  History,  laboratories, x-rays, prior ERCP and EUS reports personally reviewed.  Pathology reviewed.  Patient personally seen and examined.  Agree with comprehensive consultation note as outlined above.  Unfortunate 68-year-old gentleman with progressive and metastatic pancreatic cancer.  Has had previous biliary stent occlusion.  Appears to have the same at this time with cholangitis.  He has stabilized since admission on antibiotics.  Progressive disease on CT imaging.  Plans for ERCP tomorrow with stent debridement and/or replacement.  The patient is high risk given his comorbidities and frail state due to advanced cancer. The nature of the procedure, as well as the risks, benefits, and alternatives were carefully and thoroughly reviewed with the patient. Ample time for discussion and questions allowed. The patient understood, was satisfied, and agreed to proceed. It is conceivable that we may not be able to access the ampullary region due to progressive growth in the region of the pancreas.  This may cause duodenal obstruction.  In any event, we will try.  If unsuccessful, percutaneous drainage would be the next step.  Annaly Skop N. Servando Kyllonen, Jr., M.D.   [facility]

## 2018-10-25 NOTE — Progress Notes (Signed)
Pt's heart rate went up to 140's. Asymptomatic. HR still tachy 100-120's Made MD aware

## 2018-10-26 ENCOUNTER — Inpatient Hospital Stay (HOSPITAL_COMMUNITY): Payer: Medicare Other

## 2018-10-26 ENCOUNTER — Inpatient Hospital Stay (HOSPITAL_COMMUNITY): Payer: Medicare Other | Admitting: Certified Registered Nurse Anesthetist

## 2018-10-26 ENCOUNTER — Encounter (HOSPITAL_COMMUNITY): Payer: Self-pay | Admitting: *Deleted

## 2018-10-26 ENCOUNTER — Encounter (HOSPITAL_COMMUNITY)
Admission: EM | Disposition: A | Discharge status: Home Health | Payer: Self-pay | Source: Other Acute Inpatient Hospital | Attending: Internal Medicine

## 2018-10-26 DIAGNOSIS — K8050 Calculus of bile duct without cholangitis or cholecystitis without obstruction: Secondary | ICD-10-CM

## 2018-10-26 DIAGNOSIS — Z4689 Encounter for fitting and adjustment of other specified devices: Secondary | ICD-10-CM

## 2018-10-26 HISTORY — PX: BILIARY STENT PLACEMENT: SHX5538

## 2018-10-26 HISTORY — PX: ENDOSCOPIC RETROGRADE CHOLANGIOPANCREATOGRAPHY (ERCP) WITH PROPOFOL: SHX5810

## 2018-10-26 HISTORY — PX: REMOVAL OF STONES: SHX5545

## 2018-10-26 LAB — CBC WITH DIFFERENTIAL/PLATELET
Abs Immature Granulocytes: 0.05 10*3/uL (ref 0.00–0.07)
Basophils Absolute: 0 10*3/uL (ref 0.0–0.1)
Basophils Relative: 0 %
Eosinophils Absolute: 0 10*3/uL (ref 0.0–0.5)
Eosinophils Relative: 1 %
HCT: 23.9 % — ABNORMAL LOW (ref 39.0–52.0)
Hemoglobin: 8.4 g/dL — ABNORMAL LOW (ref 13.0–17.0)
Immature Granulocytes: 1 %
Lymphocytes Relative: 11 %
Lymphs Abs: 0.5 10*3/uL — ABNORMAL LOW (ref 0.7–4.0)
MCH: 32.3 pg (ref 26.0–34.0)
MCHC: 35.1 g/dL (ref 30.0–36.0)
MCV: 91.9 fL (ref 80.0–100.0)
Monocytes Absolute: 1 10*3/uL (ref 0.1–1.0)
Monocytes Relative: 19 %
Neutro Abs: 3.5 10*3/uL (ref 1.7–7.7)
Neutrophils Relative %: 68 %
Platelets: 91 10*3/uL — ABNORMAL LOW (ref 150–400)
RBC: 2.6 MIL/uL — ABNORMAL LOW (ref 4.22–5.81)
RDW: 15.9 % — ABNORMAL HIGH (ref 11.5–15.5)
WBC: 5.1 10*3/uL (ref 4.0–10.5)
nRBC: 0 % (ref 0.0–0.2)

## 2018-10-26 LAB — BASIC METABOLIC PANEL
Anion gap: 6 (ref 5–15)
BUN: 8 mg/dL (ref 8–23)
CO2: 28 mmol/L (ref 22–32)
Calcium: 7.7 mg/dL — ABNORMAL LOW (ref 8.9–10.3)
Chloride: 100 mmol/L (ref 98–111)
Creatinine, Ser: 0.47 mg/dL — ABNORMAL LOW (ref 0.61–1.24)
GFR calc Af Amer: >60 mL/min (ref >60)
GFR calc non Af Amer: >60 mL/min (ref >60)
Glucose, Bld: 116 mg/dL — ABNORMAL HIGH (ref 70–99)
Potassium: 3.4 mmol/L — ABNORMAL LOW (ref 3.5–5.1)
Sodium: 134 mmol/L — ABNORMAL LOW (ref 135–145)

## 2018-10-26 LAB — SARS CORONAVIRUS 2 (TAT 6-24 HRS): SARS Coronavirus 2: NEGATIVE

## 2018-10-26 SURGERY — ENDOSCOPIC RETROGRADE CHOLANGIOPANCREATOGRAPHY (ERCP) WITH PROPOFOL
Anesthesia: General

## 2018-10-26 MED ORDER — SODIUM CHLORIDE 0.9 % IV SOLN
INTRAVENOUS | Status: DC | PRN
  Administered 2018-10-26: 30 mL

## 2018-10-26 MED ORDER — SODIUM CHLORIDE 0.9 % IV SOLN
INTRAVENOUS | Status: DC | PRN
  Administered 2018-10-26: 25 ug/min via INTRAVENOUS

## 2018-10-26 MED ORDER — ADULT MULTIVITAMIN W/MINERALS CH
1.0000 | ORAL_TABLET | Freq: Every day | ORAL | Status: DC
  Administered 2018-10-26 – 2018-11-01 (×7): 1 via ORAL
  Filled 2018-10-26 (×7): qty 1

## 2018-10-26 MED ORDER — ROCURONIUM BROMIDE 10 MG/ML (PF) SYRINGE
PREFILLED_SYRINGE | INTRAVENOUS | Status: DC | PRN
  Administered 2018-10-26: 40 mg via INTRAVENOUS

## 2018-10-26 MED ORDER — LIDOCAINE 2% (20 MG/ML) 5 ML SYRINGE
INTRAMUSCULAR | Status: DC | PRN
  Administered 2018-10-26 (×2): 40 mg via INTRAVENOUS

## 2018-10-26 MED ORDER — ENSURE ENLIVE PO LIQD
237.0000 mL | Freq: Three times a day (TID) | ORAL | Status: DC
  Administered 2018-10-26 – 2018-10-31 (×13): 237 mL via ORAL

## 2018-10-26 MED ORDER — PROPOFOL 10 MG/ML IV BOLUS
INTRAVENOUS | Status: DC | PRN
  Administered 2018-10-26: 100 mg via INTRAVENOUS
  Administered 2018-10-26: 30 mg via INTRAVENOUS
  Administered 2018-10-26 (×2): 20 mg via INTRAVENOUS

## 2018-10-26 MED ORDER — FENTANYL CITRATE (PF) 250 MCG/5ML IJ SOLN
INTRAMUSCULAR | Status: DC | PRN
  Administered 2018-10-26: 50 ug via INTRAVENOUS

## 2018-10-26 MED ORDER — PHENYLEPHRINE 40 MCG/ML (10ML) SYRINGE FOR IV PUSH (FOR BLOOD PRESSURE SUPPORT)
PREFILLED_SYRINGE | INTRAVENOUS | Status: DC | PRN
  Administered 2018-10-26 (×2): 80 ug via INTRAVENOUS

## 2018-10-26 MED ORDER — ONDANSETRON HCL 4 MG/2ML IJ SOLN
INTRAMUSCULAR | Status: DC | PRN
  Administered 2018-10-26: 4 mg via INTRAVENOUS

## 2018-10-26 MED ORDER — INDOMETHACIN 50 MG RE SUPP
RECTAL | Status: AC
  Filled 2018-10-26: qty 2

## 2018-10-26 MED ORDER — SUGAMMADEX SODIUM 200 MG/2ML IV SOLN
INTRAVENOUS | Status: DC | PRN
  Administered 2018-10-26: 150 mg via INTRAVENOUS

## 2018-10-26 MED ORDER — GLUCAGON HCL RDNA (DIAGNOSTIC) 1 MG IJ SOLR
INTRAMUSCULAR | Status: AC
  Filled 2018-10-26: qty 1

## 2018-10-26 NOTE — Transfer of Care (Signed)
Immediate Anesthesia Transfer of Care Note  Patient: Tommy Oconnor  Procedure(s) Performed: ENDOSCOPIC RETROGRADE CHOLANGIOPANCREATOGRAPHY (ERCP) WITH PROPOFOL (N/A )  Patient Location: Endoscopy Unit  Anesthesia Type:General  Level of Consciousness: drowsy  Airway & Oxygen Therapy: Patient Spontanous Breathing and Patient connected to nasal cannula oxygen  Post-op Assessment: Report given to RN and Post -op Vital signs reviewed and stable  Post vital signs: Reviewed and stable  Last Vitals:  Vitals Value Taken Time  BP    Temp    Pulse 108 10/26/18 1108  Resp 21 10/26/18 1108  SpO2 100 % 10/26/18 1108  Vitals shown include unvalidated device data.  Last Pain:  Vitals:   10/26/18 0840  TempSrc: Oral  PainSc: 0-No pain      Patients Stated Pain Goal: 3 ([id number])  Complications: No apparent anesthesia complications

## 2018-10-26 NOTE — Anesthesia Procedure Notes (Signed)
Procedure Name: Intubation Date/Time: 10/26/2018 9:43 AM Performed by: Valda Favia, CRNA Pre-anesthesia Checklist: Patient identified, Emergency Drugs available, Suction available, Patient being monitored and Timeout performed Patient Re-evaluated:Patient Re-evaluated prior to induction Oxygen Delivery Method: Circle system utilized Preoxygenation: Pre-oxygenation with 100% oxygen Induction Type: IV induction Ventilation: Mask ventilation without difficulty and Oral airway inserted - appropriate to patient size Laryngoscope Size: Mac and 4 Grade View: Grade I Tube type: Oral Tube size: 7.0 mm Number of attempts: 2 (patient not fully relaxed from Rocuronium) Airway Equipment and Method: Stylet Placement Confirmation: ETT inserted through vocal cords under direct vision,  positive ETCO2 and breath sounds checked- equal and bilateral Secured at: 21 cm Tube secured with: Tape Dental Injury: Teeth and Oropharynx as per pre-operative assessment

## 2018-10-26 NOTE — Anesthesia Postprocedure Evaluation (Signed)
Anesthesia Post Note  Patient: Tommy Oconnor  Procedure(s) Performed: ENDOSCOPIC RETROGRADE CHOLANGIOPANCREATOGRAPHY (ERCP) WITH PROPOFOL (N/A )     Patient location during evaluation: PACU Anesthesia Type: General Level of consciousness: awake and alert and oriented Pain management: pain level controlled Vital Signs Assessment: post-procedure vital signs reviewed and stable Respiratory status: spontaneous breathing, nonlabored ventilation and respiratory function stable Cardiovascular status: blood pressure returned to baseline Postop Assessment: no apparent nausea or vomiting Anesthetic complications: no    Last Vitals:  Vitals:   10/26/18 1158 10/26/18 1201  BP: 115/77 106/80  Pulse: (!) 114 (!) 114  Resp: 16   Temp: 36.8 C   SpO2: 93% 94%    Last Pain:  Vitals:   10/26/18 1158  TempSrc: Oral  PainSc:                  Brennan Bailey

## 2018-10-26 NOTE — Op Note (Signed)
[facility] Patient Name: Tommy Oconnor Procedure Date : 10/26/2018 MRN: [id number] Attending MD: Docia Chuck. Henrene Pastor , MD Date of Birth: 1950-08-10 CSN: [id number] Age: 68 Admit Type: Inpatient Procedure:                ERCP with bile duct stone/sludge removal; metal                            stent placement Indications:              Malignant stricture of the common bile duct.                            Pancreatic cancer. Suspected obstruction of prior                            stent. Providers:                Docia Chuck. Henrene Pastor, MD, Raynelle Bring, RN, Cletis Athens,                            Technician Referring MD:             [facility] hospitalist Medicines:                General Anesthesia Complications:            No immediate complications. Estimated Blood Loss:     Estimated blood loss: none. Procedure:                Pre-Anesthesia Assessment:                           - Prior to the procedure, a History and Physical                            was performed, and patient medications and                            allergies were reviewed. The patient's tolerance of                            previous anesthesia was also reviewed. The risks                            and benefits of the procedure and the sedation                            options and risks were discussed with the patient.                            All questions were answered, and informed consent                            was obtained. Prior Anticoagulants: The patient has                            taken no  previous anticoagulant or antiplatelet                            agents. ASA Grade Assessment: IV - A patient with                            severe systemic disease that is a constant threat                            to life. After reviewing the risks and benefits,                            the patient was deemed in satisfactory condition to                            undergo the procedure.  Preoperative antibiotics                            were continued.                           After obtaining informed consent, the scope was                            passed under direct vision. Throughout the                            procedure, the patient's blood pressure, pulse, and                            oxygen saturations were monitored continuously. The                            [id number] Olympus duodenoscope was                            introduced through the mouth, and used to inject                            contrast into and used to inject contrast into the                            bile duct. The ERCP was somewhat difficult due to                            extrinsic compression. The patient tolerated the                            procedure well. Scope In: Scope Out: Findings:      The stomach toward the antrum was significantly compressed by tumor as       well as the duodenal bulb. With difficulty the scope was advanced to the       second portion of the duodenum where the previously placed metal stent  was easily observed. Initial x-ray images were of the stent and scope       (in the long position). The bile duct was then cannulated through the       previous stent with a standard sphincterotome and wire. Contrast was       injected. Obvious debris throughout the stent. The cannula was exchanged       for a sequential balloon. A 12 mm balloon starting, at the bifurcation,       extracted debris, biofilm, and soft stones. Injection of contrast       revealed clearing of the previously placed stent. Thereafter, one 10 Fr       by 8 cm uncovered metal stent was placed 1 cm into the common bile duct       will portion above the previously placed stent and the distal portion 1       cm into the duodenum. Bile flowed through the stent. The stent was in       good position. Impression:               1. Compression of the distal stomach and duodenum                             by the tumor                           2. Obstructed biliary stent status post biliary                            debris and sludge extraction followed by placement                            of noncovered self-expanding metal stent (10 mm                            diameter, 8 cm length).. Recommendation:           1. Observe patient's clinical course.                           2. Continue antibiotics                           3. Diet of choice                           4. Monitor blood work                           5. GI will follow up in a.m. Procedure Code(s):        --- Professional ---                           [phone], Endoscopic retrograde                            cholangiopancreatography (ERCP); with placement of  endoscopic stent into biliary or pancreatic duct,                            including pre- and post-dilation and guide wire                            passage, when performed, including sphincterotomy,                            when performed, each stent                           [id number], Endoscopic retrograde                            cholangiopancreatography (ERCP); with removal of                            calculi/debris from biliary/pancreatic duct(s) Diagnosis Code(s):        --- Professional ---                           K80.51, Calculus of bile duct without cholangitis                            or cholecystitis with obstruction CPT copyright 2019 American Medical Association. All rights reserved. The codes documented in this report are preliminary and upon coder review may  be revised to meet current compliance requirements. Docia Chuck. Henrene Pastor, MD 10/26/2018 11:13:07 AM This report has been signed electronically. Number of Addenda: 0

## 2018-10-26 NOTE — TOC Progression Note (Signed)
Transition of Care [facility]) - Progression Note    Patient Details  Name: Tommy Oconnor MRN: [id number] Date of Birth: Sep 15, 1950  [facility]) CM/SW Contact  Zenon Mayo, RN Phone Number: 10/26/2018, 6:21 PM  Clinical Narrative:    From home alone, hx of biliary stent, ERP today, he did receive chemo for pancreatic cancer x 1 year , but that is on hold now, here with sepsis, n/v, iv abx, GI to see.  TOC team will cont to follow for TOC needs.        Expected Discharge Plan and Services                                                 Social Determinants of Health (SDOH) Interventions    Readmission Risk Interventions No flowsheet data found.

## 2018-10-26 NOTE — Progress Notes (Signed)
Initial Nutrition Assessment  DOCUMENTATION CODES:   Severe malnutrition in context of chronic illness  INTERVENTION:   -Ensure Enlive po TID, each supplement provides 350 kcal and 20 grams of protein -MVI with minerals daily -Magic cup TID with meals, each supplement provides 290 kcal and 9 grams of protein  NUTRITION DIAGNOSIS:   Severe Malnutrition related to chronic illness(pancreatic cancer) as evidenced by moderate fat depletion, severe fat depletion, moderate muscle depletion, severe muscle depletion.  GOAL:   Patient will meet greater than or equal to 90% of their needs  MONITOR:   PO intake, Supplement acceptance, Labs, Weight trends, Skin, I & O's  REASON FOR ASSESSMENT:   Consult Assessment of nutrition requirement/status  ASSESSMENT:   68 year old with a history of pancreatic cancer x1 year admitted with sepsis.  Pt admitted with sepsis.   8/21- s/p ERCP, which revealed compression of the distal stomach and duodenum by the tumor and bstructed biliary stent status post biliary debris and sludge extraction followed by placement of noncovered self-expanding metal stent (10 mm diameter, 8 cm length)  Pt with pancreatic cancer. Per chart review, pt currently undergoing chemotherapy at [facility] and has completed radiation.   Pt very drowsy at time of visit; he had just returned from ERCP. He reports feeling well today. He reports fair appetite PTA, usually consuming 2 meals per day.   Pt denies weight loss, reporting UBW of around 172# (however, current wt 138#). Noted pt has experienced a 1.1% wt loss over the past year, which is not significant for time frame.  Discussed with pt importance of good meal and supplement intake to promote healing. He is amenable to oral nutrition supplements.   Medications reviewed and include creon, methadone, and lactated ringers infusion @ 75 ml/hr.   Labs reviewed: Na: 134, K: 3.4 (on PO supplementation).    NUTRITION - FOCUSED PHYSICAL EXAM:    Most Recent Value  Orbital Region  Severe depletion  Upper Arm Region  Severe depletion  Thoracic and Lumbar Region  Moderate depletion  Buccal Region  Severe depletion  Temple Region  Severe depletion  Clavicle Bone Region  Severe depletion  Clavicle and Acromion Bone Region  Severe depletion  Scapular Bone Region  Severe depletion  Dorsal Hand  Moderate depletion  Patellar Region  Severe depletion  Anterior Thigh Region  Severe depletion  Posterior Calf Region  Severe depletion  Edema (RD Assessment)  None  Hair  Reviewed  Eyes  Reviewed  Mouth  Reviewed  Skin  Reviewed  Nails  Reviewed       Diet Order:   Diet Order            Diet regular Room service appropriate? Yes; Fluid consistency: Thin  Diet effective now              EDUCATION NEEDS:   Education needs have been addressed  Skin:  Skin Assessment: Reviewed RN Assessment  Last BM:  10/24/18  Height:   Ht Readings from Last 1 Encounters:  10/25/18 5\' 8"  (1.727 m)    Weight:   Wt Readings from Last 1 Encounters:  10/26/18 62.8 kg    Ideal Body Weight:  70 kg  BMI:  Body mass index is 21.04 kg/m.  Estimated Nutritional Needs:   Kcal:  2000-2200  Protein:  95-110 grams  Fluid:  > 2 L    Jahmal Dunavant A. Jimmye Norman, [place], LDN, [place] Registered Dietitian II Certified Diabetes Care and Education Specialist Pager: [phone]  After hours Pager: [phone]

## 2018-10-26 NOTE — Interval H&P Note (Signed)
History and Physical Interval Note:  10/26/2018 9:29 AM  Tommy Oconnor  has presented today for surgery, with the diagnosis of Suspect occluded common bile duct stent.  Malignant pancreatic cancer.  GNR's on Gram stain 4 days ago.  On Zosyn..  The various methods of treatment have been discussed with the patient and family. After consideration of risks, benefits and other options for treatment, the patient has consented to  Procedure(s): ENDOSCOPIC RETROGRADE CHOLANGIOPANCREATOGRAPHY (ERCP) WITH PROPOFOL (N/A) as a surgical intervention.  The patient's history has been reviewed, patient examined, no change in status, stable for surgery.  I have reviewed the patient's chart and labs.  Questions were answered to the patient's satisfaction.     Scarlette Shorts

## 2018-10-26 NOTE — Anesthesia Preprocedure Evaluation (Addendum)
Anesthesia Evaluation  Patient identified by MRN, date of birth, ID band Patient awake    Reviewed: Allergy & Precautions, NPO status , Patient's Chart, lab work & pertinent test results  History of Anesthesia Complications Negative for: history of anesthetic complications  Airway Mallampati: II  TM Distance: >3 FB Neck ROM: Full    Dental  (+) Edentulous Upper, Edentulous Lower   Pulmonary neg pulmonary ROS,    Pulmonary exam normal        Cardiovascular negative cardio ROS Normal cardiovascular exam     Neuro/Psych negative neurological ROS     GI/Hepatic Neg liver ROS, Pancreatic ca   Endo/Other  negative endocrine ROS  Renal/GU negative Renal ROS     Musculoskeletal negative musculoskeletal ROS (+)   Abdominal   Peds  Hematology  (+) anemia , Hgb 8.4, plt 91   Anesthesia Other Findings Chronic pain on methadone 10mg  BID and oxycodone 15mg  QID PRN  Reproductive/Obstetrics                            Anesthesia Physical Anesthesia Plan  ASA: III  Anesthesia Plan: General   Post-op Pain Management:    Induction: Intravenous  PONV Risk Score and Plan: 3 and Treatment may vary due to age or medical condition, Ondansetron, Dexamethasone and Midazolam  Airway Management Planned: Oral ETT  Additional Equipment: None  Intra-op Plan:   Post-operative Plan: Extubation in OR  Informed Consent: I have reviewed the patients History and Physical, chart, labs and discussed the procedure including the risks, benefits and alternatives for the proposed anesthesia with the patient or authorized representative who has indicated his/her understanding and acceptance.   Patient has DNR.  Discussed DNR with patient and Continue DNR.   Dental advisory given  Plan Discussed with: CRNA  Anesthesia Plan Comments:        Anesthesia Quick Evaluation

## 2018-10-26 NOTE — Progress Notes (Signed)
PROGRESS NOTE  Tommy Oconnor [id number] DOB: [birth year] DOA: 10/23/2018 PCP: Tommy Limbo, PA-C  Brief History   Tommy Oconnor is a 68 year old male formerly employed as a Games developer never smoker never drinker he was diagnosed with pancreatic cancer approximately 1 year ago.  He is completed radiation therapy.  Currently receiving chemotherapy from an oncologist in [facility]  He reported [facility] 24 hours ago with a 1 day complaint of nausea vomiting diarrhea.  Found to be hypertensive and difficulty breathing.  He underwent a paracentesis removal of 2100 cc of fluid which improved his breathing status.  Treated with empirical antimicrobial therapy with Zosyn and Levaquin.Marland Kitchen  He is a limited CODE BLUE with no intubation no shock or CPR.  Vasoactive medications are allowed.  The patietn was transferred to the ICU at [facility] to the care of Tommy Oconnor. The patient required pressor support over the course of the last 24 hours. He was transferred to the floor to the care of Tommy Oconnor on 10/24/2018.  I have discussed this patient with his oncologist Tommy Guise, MD. He tells me that he feels that the patient is doing poorly with his treatment plan. He has just completed 2 cycles of gemcitabine and Abraxane which is palliative treatment following his completion of his radiation therapy. The most recent CT abdomen 10/12/2018 demonstrates dilatation of the biliary duct. It also demonstrated increase in size of the pancreatic mass. There are also new hepatic lesions that are concerning for metastasis. The patient has no prior history of  hepatic mets as per [facility].   The patient underwent ERCP with Tommy O[facility] on 10/26/2018. The study revealed compression of the distal stomach and duodenum. The previously placed biliary stent was found to be occluded. It was removed and another non-covered self-expanding metal stent was placed. Bile was flowing freely out of the duct at the end of the  procedure.  Consultants   Tommy [facility]  Procedures  Paracentesis with removal of 2.1 L at [facility] on 08/23/2018.   Antibiotics   Anti-infectives (From admission, onward)   Start     Dose/Rate Route Frequency Ordered Stop   10/23/18 2245  vancomycin (VANCOCIN) IVPB 750 mg/150 ml premix  Status:  Discontinued     750 mg 150 mL/hr over 60 Minutes Intravenous Every 12 hours 10/23/18 1245 10/24/18 0842   10/23/18 1945  piperacillin-tazobactam (ZOSYN) IVPB 3.375 g     3.375 g 12.5 mL/hr over 240 Minutes Intravenous Every 8 hours 10/23/18 1245     10/23/18 1015  piperacillin-tazobactam (ZOSYN) IVPB 3.375 g     3.375 g 100 mL/hr over 30 Minutes Intravenous NOW 10/23/18 1013 10/23/18 1212   10/23/18 1015  vancomycin (VANCOCIN) IVPB 1000 mg/200 mL premix     1,000 mg 200 mL/hr over 60 Minutes Intravenous NOW 10/23/18 1013 10/23/18 1144      Subjective  The patient is cachectic and frail appearing. He is awake, alert, and oriented x 3. He is complaining of the re-accumulation of ascitic fluid.  Objective   Vitals:  Vitals:   10/26/18 1500 10/26/18 1547  BP: 111/78 96/73  Pulse: (!) 122 (!) 120  Resp:  17  Temp:  98.8 F (37.1 C)  SpO2:  96%    Exam:  Constitutional:   The patient appears frail and chronically ill. He is awake, alert, and oriented x 3. No acute distress. Respiratory:   No increased work of breathing.  No wheezes, rales, or rhonchi.  No tactile fremitus. Cardiovascular:  Regular rate and rhythm.  No murmurs, ectopy, or gallups are appreciated.  No lateral PMI. No thrills. Abdomen:   Abdomen is somewhat distended, but soft and non-tender.  Positive fluid wave.  No hernias, masses, or organomegaly.  Hypoactive bowel sounds. Musculoskeletal:   No cyanosis, clubbing.  +2 pitting edema of lower extremities bilaterally. Skin:   No rashes, lesions, ulcers  palpation of skin: no induration or nodules Neurologic:   CN 2-12  intact  Sensation all 4 extremities intact Psychiatric:   Mental status o Mood, affect appropriate o Orientation to person, place, time   judgment and insight appear intact   I have personally reviewed the following:   Today's Data   CMP, CBC, Vitals   Micro Data   1/4 BC from [facility] has gram stain positive for GNR. Blood cultures at [facility] have had no growth.  Scheduled Meds:  feeding supplement (ENSURE ENLIVE)  237 mL Oral TID BM   lipase/protease/amylase  36,000 Units Oral 2 times per day   lipase/protease/amylase  72,000 Units Oral TID WC   methadone  10 mg Oral BID   multivitamin with minerals  1 tablet Oral Daily   OLANZapine  10 mg Oral QHS   pantoprazole  40 mg Oral Q0600   potassium chloride SA  20 mEq Oral Daily   Continuous Infusions:  sodium chloride 250 mL (10/26/18 0401)   lactated ringers 75 mL/hr at 10/26/18 0438   piperacillin-tazobactam (ZOSYN)  IV 3.375 g (10/26/18 1222)    Active Problems:   Bile duct obstruction   Sepsis ([facility])   Encounter for replacement of biliary stent   Gall stones, common bile duct   LOS: 3 days   A & P   Sepsis from presumed GI source. Resolved.  In the setting of immunocompromised male with pancreatic cancer for 1 year having had past radiation and now currently enrolled in chemotherapy. He has completed 2 cycles of palliative chemotherapy comprised of Abraxane and gemcitabine. The patient was admitted to the ICU to the care of Tommy Oconnor as he required pressor support. He was transferred to [facility] on 10/24/2018. 1/4 blood cultures from [facility] had gram stain positive for GNR. No growth from any cultures, and no growth from blood cultures drawn at [facility]. The patient receiving Vancomycin and Zosyn at [facility]. Vancomycin has since been discontinued. Lactic acid has come down to 1.3 from 2.3.  Pancreatic cancer: Now with apparent liver mets and increase in the size of the pancreatic  mass per CT abdomen performed on 10/12/2018. He has just completed 2 cycles of gemcitabine and Abraxane which is palliative treatment following his completion of his radiation therapy. Tommy O[facility] feels that the patient is on a downward trajectory.  Obstructive jaundice: Concern for obstruction of biliary stent resulting in elelvated LFT's and patient's sepsis. GI has been consulted. ERCP today with removal of prior stent and clearance of obstruction, and placement of new non-covered self-expanding metal stent.   Nausea/vomiting/diarrhea: Advance diet as tolerated. No further diarrhea per patient.   Failure to thrive: Consult nutrition to maximize the patient's nutritional intake.  Hypokalemia: Due to GI losses. Supplement and monitor.   Anemia of chronic disease/neoplasm: Monitor.  I have seen and examined this patient myself. I have spent 35 minutes in his evaluation and care.    DVT PROPHYLAXIS: Heparin CODE STATUS: Limited CODE BLUE no shock or intubation.  Vasopressors are allowed FAMILY DISCUSSIONS: No family available. DISPOSITION tbd. Anna-Marie Coller, DO Tommy [facility]nor  Direct contact: see [email or web address]  7PM-7AM contact night coverage as above 10/26/2018, 4:32 PM  LOS: 2 days

## 2018-10-27 LAB — BLOOD CULTURE ID PANEL (REFLEXED)

## 2018-10-27 LAB — CBC WITH DIFFERENTIAL/PLATELET
Abs Immature Granulocytes: 0.04 10*3/uL (ref 0.00–0.07)
Basophils Absolute: 0 10*3/uL (ref 0.0–0.1)
Basophils Relative: 0 %
Eosinophils Absolute: 0 10*3/uL (ref 0.0–0.5)
Eosinophils Relative: 1 %
HCT: 21.7 % — ABNORMAL LOW (ref 39.0–52.0)
Hemoglobin: 7.6 g/dL — ABNORMAL LOW (ref 13.0–17.0)
Immature Granulocytes: 1 %
Lymphocytes Relative: 15 %
Lymphs Abs: 0.6 10*3/uL — ABNORMAL LOW (ref 0.7–4.0)
MCH: 32.1 pg (ref 26.0–34.0)
MCHC: 35 g/dL (ref 30.0–36.0)
MCV: 91.6 fL (ref 80.0–100.0)
Monocytes Absolute: 0.9 10*3/uL (ref 0.1–1.0)
Monocytes Relative: 22 %
Neutro Abs: 2.4 10*3/uL (ref 1.7–7.7)
Neutrophils Relative %: 61 %
Platelets: 106 10*3/uL — ABNORMAL LOW (ref 150–400)
RBC: 2.37 MIL/uL — ABNORMAL LOW (ref 4.22–5.81)
RDW: 16.5 % — ABNORMAL HIGH (ref 11.5–15.5)
WBC: 4 10*3/uL (ref 4.0–10.5)
nRBC: 0 % (ref 0.0–0.2)

## 2018-10-27 LAB — COMPREHENSIVE METABOLIC PANEL
ALT: 39 U/L (ref 0–44)
AST: 41 U/L (ref 15–41)
Albumin: 1.6 g/dL — ABNORMAL LOW (ref 3.5–5.0)
Alkaline Phosphatase: 425 U/L — ABNORMAL HIGH (ref 38–126)
Anion gap: 7 (ref 5–15)
BUN: 10 mg/dL (ref 8–23)
CO2: 28 mmol/L (ref 22–32)
Calcium: 7.4 mg/dL — ABNORMAL LOW (ref 8.9–10.3)
Chloride: 100 mmol/L (ref 98–111)
Creatinine, Ser: 0.46 mg/dL — ABNORMAL LOW (ref 0.61–1.24)
GFR calc Af Amer: >60 mL/min (ref >60)
GFR calc non Af Amer: >60 mL/min (ref >60)
Glucose, Bld: 137 mg/dL — ABNORMAL HIGH (ref 70–99)
Potassium: 3.2 mmol/L — ABNORMAL LOW (ref 3.5–5.1)
Sodium: 135 mmol/L (ref 135–145)
Total Bilirubin: 6.8 mg/dL — ABNORMAL HIGH (ref 0.3–1.2)
Total Protein: 4.8 g/dL — ABNORMAL LOW (ref 6.5–8.1)

## 2018-10-27 MED ORDER — POTASSIUM CHLORIDE CRYS ER 20 MEQ PO TBCR
40.0000 meq | EXTENDED_RELEASE_TABLET | ORAL | Status: AC
  Administered 2018-10-27 (×2): 40 meq via ORAL
  Filled 2018-10-27 (×3): qty 2

## 2018-10-27 MED ORDER — SODIUM CHLORIDE 0.9 % IV BOLUS
1000.0000 mL | Freq: Once | INTRAVENOUS | Status: AC
  Administered 2018-10-27: 1000 mL via INTRAVENOUS

## 2018-10-27 NOTE — Progress Notes (Signed)
PHARMACY - PHYSICIAN COMMUNICATION CRITICAL VALUE ALERT - BLOOD CULTURE IDENTIFICATION (BCID)  Tommy Oconnor is an 68 y.o. male who presented to [facility] on 10/23/2018 with a chief complaint of sepsis from likely GI source, transfer from [place]:  1/4 blood cx + for gpc, BCID neg likely contaminant    Name of physician (or Provider) Contacted: Dr Benny Lennert  Current antibiotics: Zosyn  Changes to prescribed antibiotics recommended:  None; continue zosyn  Results for orders placed or performed during the hospital encounter of 10/23/18  Blood Culture ID Panel (Reflexed) (Collected: 10/23/2018 10:40 AM)  Result Value Ref Range   Enterococcus species NOT DETECTED NOT DETECTED   Listeria monocytogenes NOT DETECTED NOT DETECTED   Staphylococcus species NOT DETECTED NOT DETECTED   Staphylococcus aureus (BCID) NOT DETECTED NOT DETECTED   Streptococcus species NOT DETECTED NOT DETECTED   Streptococcus agalactiae NOT DETECTED NOT DETECTED   Streptococcus pneumoniae NOT DETECTED NOT DETECTED   Streptococcus pyogenes NOT DETECTED NOT DETECTED   Acinetobacter baumannii NOT DETECTED NOT DETECTED   Enterobacteriaceae species NOT DETECTED NOT DETECTED   Enterobacter cloacae complex NOT DETECTED NOT DETECTED   Escherichia coli NOT DETECTED NOT DETECTED   Klebsiella oxytoca NOT DETECTED NOT DETECTED   Klebsiella pneumoniae NOT DETECTED NOT DETECTED   Proteus species NOT DETECTED NOT DETECTED   Serratia marcescens NOT DETECTED NOT DETECTED   Haemophilus influenzae NOT DETECTED NOT DETECTED   Neisseria meningitidis NOT DETECTED NOT DETECTED   Pseudomonas aeruginosa NOT DETECTED NOT DETECTED   Candida albicans NOT DETECTED NOT DETECTED   Candida glabrata NOT DETECTED NOT DETECTED   Candida krusei NOT DETECTED NOT DETECTED   Candida parapsilosis NOT DETECTED NOT DETECTED   Candida tropicalis NOT DETECTED NOT DETECTED   Levester Fresh, PharmD, BCPS, BCCCP Clinical  Pharmacist [phone]  Please check AMION for all [place] numbers  10/27/2018 11:09 AM

## 2018-10-27 NOTE — Progress Notes (Signed)
CROSS COVER [facility]-GI Subjective: Tommy Oconnor is a 68 year old white male who was diagnosed with pancreatic cancer about a year ago.  He is under the care of his oncologist Dr. Lavera Guise and [facility] he completed radiation therapy and is receiving chemotherapy [gemcitabine and Abraxane].  He presented with a rock [facility] with a 24-hour history of nausea vomiting and diarrhea was found to be short of breath and hypertensive.  Patient had large volume paracentesis with 2100 cc of ascitic fluid removed which improved his respiratory status he was treated with Zosyn and Levaquin.  He was transferred to the ICU at [facility] where he required pressors for 24 hours and then was sent and was then transferred to the floor.  His recent CT done on 10/12/2018 revealed dilatation of the biliary duct and multiple hepatic lesions concerning for metastatic disease.  He underwent an ERCP by Dr. Scarlette Shorts yesterday when a stent exchange was done a metal stent was placed as his previous stent had occluded.  The procedure was technically difficult as the patient had developed some gastric compression especially in the distal some stomach and proximal duodenum.  Patient complains of worsening abdominal distention with regular accumulation of his ascites and is requesting pain medication.  He has had mild nausea but denies having any vomiting.Marland Kitchen He is a limited code with no intubation shock or CPR vasopressors are allowed.  Objective: Vital signs in last 24 hours: Temp:  [97.9 F (36.6 C)-99.1 F (37.3 C)] 97.9 F (36.6 C) (08/22 0514) Pulse Rate:  [107-140] 136 (08/21 1930) Resp:  [13-20] 14 (08/22 0514) BP: (83-138)/(64-94) 89/64 (08/22 [id number]) SpO2:  [93 %-100 %] 94 % (08/22 0002) Weight:  [64 kg] 64 kg (08/22 0514) Last BM Date: 10/26/18  Intake/Output from previous day: 08/21 0701 - 08/22 0700 In: 1548.3 [P.O.:600; I.V.:17.7; IV Piggyback:930.6] Out: 727 [Urine:725;  Blood:2] Intake/Output this shift: No intake/output data recorded.  General appearance: cooperative, appears older than stated age, cachectic, fatigued, mild distress and pale Resp: clear to auscultation bilaterally Cardio: regular rate and rhythm, S1, S2 normal, no murmur, click, rub or gallop GI: soft, slightly distended with positive fluid wave diffusely tender with hypoactive bowel sounds; no masses,  no organomegaly Extremities: extremities normal, atraumatic, no cyanosis or edema  Lab Results: Recent Labs    10/25/18 0635 10/26/18 0422 10/27/18 0331  WBC 5.2 5.1 4.0  HGB 9.0* 8.4* 7.6*  HCT 26.1* 23.9* 21.7*  PLT 81* 91* 106*   BMET Recent Labs    10/25/18 0635 10/26/18 0422 10/27/18 0331  NA 134* 134* 135  K 3.4* 3.4* 3.2*  CL 98 100 100  CO2 27 28 28   GLUCOSE 103* 116* 137*  BUN 11 8 10   CREATININE 0.52* 0.47* 0.46*  CALCIUM 8.0* 7.7* 7.4*   LFT Recent Labs    10/24/18 0853 10/27/18 0331  PROT 5.1* 4.8*  ALBUMIN 1.9* 1.6*  AST 74* 41  ALT 56* 39  ALKPHOS 426* 425*  BILITOT 3.9* 6.8*  BILIDIR 2.5*  --   IBILI 1.4*  --    Studies/Results: Dg Ercp  Result Date: 10/26/2018 CLINICAL DATA:  68 year old male with a history of biliary stent EXAM: ERCP TECHNIQUE: Multiple spot images obtained with the fluoroscopic device and submitted for interpretation post-procedure. FLUOROSCOPY TIME:  Fluoroscopy Time:  12 minutes 11 seconds COMPARISON:  May 10, 2018 FINDINGS: Limited intraoperative fluoroscopic spot images during ERCP. Initial image demonstrates endoscope projecting over the upper abdomen, as well as  metallic biliary stent in position. Subsequently there is cannulation of the ampulla and partial opacification of the intrahepatic and extrahepatic biliary ducts. Deployment of retrieval balloon and placement of additional metallic stent IMPRESSION: Limited images of ERCP demonstrates partial opacification of the intrahepatic and extrahepatic biliary ducts,  deployment of a retrieval balloon, and extension of the biliary stent system. Please refer to the dictated operative report for full details of intraoperative findings and procedure. Electronically Signed   By: Corrie Mckusick D.O.   On: 10/26/2018 11:32   Medications: I have reviewed the patient's current medications.  Assessment/Plan: 1) Obstructive jaundice with pancreatic mass in the body of the pancreas and liver mets on a recent CT along with ascites-his total bilirubin has increased from 3.9-6.8 today phosphatase remains about the same with a slight decrease in his AST from 74-41 and ALT from 56-39; this is worrisom and his alkaline e as he clearly had bile flowing from the metallic stent placed by Dr. Scarlette Shorts yesterday.  We will recheck a liver panel tomorrow.  We will can continue supportive care for now. 2) ?Sepsis-patient had gram-negative rods blood cultures done at [facility] but his cultures at [facility] have been negative so far. 3) Severe malnutrition with failure to thrive.  Patient's diarrhea has resolved. 4) Anemia of chronic disease. 5) Hypokalemia-replace as needed  LOS: 4 days   Tommy Oconnor 10/27/2018, 8:04 AM

## 2018-10-27 NOTE — Progress Notes (Signed)
Patient's family member was trying to call, but patient was sleeping. Family advised to call back later.

## 2018-10-27 NOTE — Progress Notes (Addendum)
PROGRESS NOTE  Tommy Oconnor [id number] DOB: March 26, 1950 DOA: 10/23/2018 PCP: [name], PA-C  Brief History   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  He is completed radiation therapy.  Currently receiving chemotherapy from an oncologist in [facility].  He reported [facility] 24 hours ago with a 1 day complaint of nausea vomiting diarrhea.  Found to be hypertensive and difficulty breathing.  He underwent a paracentesis removal of 2100 cc of fluid which improved his breathing status.  Treated with empirical antimicrobial therapy with Zosyn and Levaquin.Marland Kitchen  He is a limited CODE BLUE with no intubation no shock or CPR.  Vasoactive medications are allowed.  The patietn was transferred to the ICU at [facility] to the care of PCCM. The patient required pressor support over the course of the last 24 hours. He was transferred to the floor to the care of [facility] on 10/24/2018.  I have discussed this patient with his oncologist Lavera Guise, MD. He tells me that he feels that the patient is doing poorly with his treatment plan. He has just completed 2 cycles of gemcitabine and Abraxane which is palliative treatment following his completion of his radiation therapy. The most recent CT abdomen 10/12/2018 demonstrates dilatation of the biliary duct. It also demonstrated increase in size of the pancreatic mass. There are also new hepatic lesions that are concerning for metastasis. The patient has no prior history of  hepatic mets as per Dr. Bobby Rumpf.   The patient underwent ERCP with Dr. Henrene Pastor on 10/26/2018. The study revealed compression of the distal stomach and duodenum. The previously placed biliary stent was found to be occluded. It was removed and another non-covered self-expanding metal stent was placed. Bile was flowing freely out of the duct at the end of the  procedure.  Consultants   Gastroenterology  Procedures  Paracentesis with removal of 2.1 L at [facility] on 08/23/2018.   Antibiotics   Anti-infectives (From admission, onward)   Start     Dose/Rate Route Frequency Ordered Stop   10/23/18 2245  vancomycin (VANCOCIN) IVPB 750 mg/150 ml premix  Status:  Discontinued     750 mg 150 mL/hr over 60 Minutes Intravenous Every 12 hours 10/23/18 1245 10/24/18 0842   10/23/18 1945  piperacillin-tazobactam (ZOSYN) IVPB 3.375 g     3.375 g 12.5 mL/hr over 240 Minutes Intravenous Every 8 hours 10/23/18 1245     10/23/18 1015  piperacillin-tazobactam (ZOSYN) IVPB 3.375 g     3.375 g 100 mL/hr over 30 Minutes Intravenous NOW 10/23/18 1013 10/23/18 1212   10/23/18 1015  vancomycin (VANCOCIN) IVPB 1000 mg/200 mL premix     1,000 mg 200 mL/hr over 60 Minutes Intravenous NOW 10/23/18 1013 10/23/18 1144      Subjective  The patient is cachectic and frail appearing. He is awake, alert, and oriented x 3. He continues to complain of the re-accumulation of ascitic fluid.  Objective   Vitals:  Vitals:   10/27/18 1001 10/27/18 1207  BP: 104/82 116/81  Pulse: (!) 120 (!) 125  Resp:  14  Temp: 98.8 F (37.1 C) 98.4 F (36.9 C)  SpO2: 94% 96%    Exam:  Constitutional:   The patient appears frail and chronically ill. He is awake, alert, and oriented x 3. No acute distress. Respiratory:   No increased work of breathing.  No wheezes, rales, or rhonchi.  No tactile fremitus. Cardiovascular:   Regular rate and rhythm.  No murmurs, ectopy, or gallups are appreciated.  No lateral PMI. No thrills. Abdomen:   Abdomen is somewhat more distended than yesterday, but non-tender.  Positive fluid wave.  No hernias, masses, or organomegaly.  Hypoactive bowel sounds. Musculoskeletal:   No cyanosis, clubbing.  +2 pitting edema of lower extremities bilaterally. Skin:   No rashes, lesions, ulcers  palpation of skin: no  induration or nodules Neurologic:   CN 2-12 intact  Sensation all 4 extremities intact Psychiatric:   Mental status o Mood, affect appropriate o Orientation to person, place, time   judgment and insight appear intact   I have personally reviewed the following:   Today's Data   CMP, CBC, Vitals   Micro Data   1/4 BC from [facility] has gram stain positive for GNR. Blood cultures at [facility] have had no growth.  Scheduled Meds:  feeding supplement (ENSURE ENLIVE)  237 mL Oral TID BM   lipase/protease/amylase  36,000 Units Oral 2 times per day   lipase/protease/amylase  72,000 Units Oral TID WC   methadone  10 mg Oral BID   multivitamin with minerals  1 tablet Oral Daily   OLANZapine  10 mg Oral QHS   pantoprazole  40 mg Oral Q0600   potassium chloride SA  20 mEq Oral Daily   potassium chloride  40 mEq Oral Q4H   Continuous Infusions:  sodium chloride Stopped (10/26/18 0832)   piperacillin-tazobactam (ZOSYN)  IV 3.375 g (10/27/18 1237)    Active Problems:   Bile duct obstruction   Sepsis ([place])   Encounter for replacement of biliary stent   Gall stones, common bile duct   LOS: 4 days   A & P   Sepsis from presumed GI source. Resolved.  In the setting of immunocompromised male with pancreatic cancer for 1 year having had past radiation and now currently enrolled in chemotherapy. He has completed 2 cycles of palliative chemotherapy comprised of Abraxane and gemcitabine. The patient was admitted to the ICU to the care of PCCM as he required pressor support. He was transferred to [facility] on 10/24/2018. 1/4 blood cultures from [facility] had gram stain positive for GNR. No growth from any cultures, and no growth from blood cultures drawn at [facility]. The patient receiving Vancomycin and Zosyn at [facility]. Vancomycin has since been discontinued. Lactic acid has come down to 1.3 from 2.3.  Pancreatic cancer: Now with apparent liver mets  and increase in the size of the pancreatic mass per CT abdomen performed on 10/12/2018. He has just completed 2 cycles of gemcitabine and Abraxane which is palliative treatment following his completion of his radiation therapy. Dr. Bobby Rumpf feels that the patient is on a downward trajectory.  Obstructive jaundice: Concern for obstruction of biliary stent resulting in elelvated LFT's and patient's sepsis. GI has been consulted. ERCP today with removal of prior stent and clearance of obstruction, and placement of new non-covered self-expanding metal stent.   Nausea/vomiting/diarrhea: Advance diet as tolerated. No further diarrhea per patient.   Failure to thrive: Consult nutrition to maximize the patient's nutritional intake.  Hypokalemia: Due to GI losses. Supplement and monitor.   Anemia of chronic disease/neoplasm: Monitor.  I have seen and examined this patient myself. I have spent 32 minutes in his evaluation and care.    DVT PROPHYLAXIS: Heparin CODE STATUS: Full Code FAMILY DISCUSSIONS: No family available. DISPOSITION tbd. Danielly Ackerley, DO [facility] Direct contact: see  [email or web address]  7PM-7AM contact night coverage as above 10/27/2018, 4:52 PM  LOS: 2 days

## 2018-10-28 DIAGNOSIS — R180 Malignant ascites: Secondary | ICD-10-CM

## 2018-10-28 DIAGNOSIS — R000 Tachycardia, unspecified: Secondary | ICD-10-CM

## 2018-10-28 LAB — COMPREHENSIVE METABOLIC PANEL
ALT: 39 U/L (ref 0–44)
AST: 57 U/L — ABNORMAL HIGH (ref 15–41)
Albumin: 1.7 g/dL — ABNORMAL LOW (ref 3.5–5.0)
Alkaline Phosphatase: 477 U/L — ABNORMAL HIGH (ref 38–126)
Anion gap: 9 (ref 5–15)
BUN: 10 mg/dL (ref 8–23)
CO2: 24 mmol/L (ref 22–32)
Calcium: 7.8 mg/dL — ABNORMAL LOW (ref 8.9–10.3)
Chloride: 103 mmol/L (ref 98–111)
Creatinine, Ser: 0.47 mg/dL — ABNORMAL LOW (ref 0.61–1.24)
GFR calc Af Amer: >60 mL/min (ref >60)
GFR calc non Af Amer: >60 mL/min (ref >60)
Glucose, Bld: 148 mg/dL — ABNORMAL HIGH (ref 70–99)
Potassium: 4.4 mmol/L (ref 3.5–5.1)
Sodium: 136 mmol/L (ref 135–145)
Total Bilirubin: 5.3 mg/dL — ABNORMAL HIGH (ref 0.3–1.2)
Total Protein: 5.5 g/dL — ABNORMAL LOW (ref 6.5–8.1)

## 2018-10-28 LAB — CULTURE, BLOOD (ROUTINE X 2)
Culture: NO GROWTH
Special Requests: ADEQUATE

## 2018-10-28 MED ORDER — LORAZEPAM 0.5 MG PO TABS
0.5000 mg | ORAL_TABLET | ORAL | Status: DC | PRN
  Administered 2018-10-28 (×2): 0.5 mg via ORAL
  Filled 2018-10-28 (×2): qty 1

## 2018-10-28 MED ORDER — NALOXEGOL OXALATE 25 MG PO TABS
25.0000 mg | ORAL_TABLET | Freq: Every day | ORAL | Status: DC
  Administered 2018-10-28 – 2018-11-01 (×5): 25 mg via ORAL
  Filled 2018-10-28 (×5): qty 1

## 2018-10-28 NOTE — Progress Notes (Addendum)
PROGRESS NOTE  Tommy Oconnor [id number] DOB: 03-14-1950 DOA: 10/23/2018 PCP: [name], PA-C  Brief History   Mr. 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  He is completed radiation therapy.  Currently receiving chemotherapy from an oncologist in [facility].  He reported [facility] 24 hours ago with a 1 day complaint of nausea vomiting diarrhea.  Found to be hypertensive and difficulty breathing.  He underwent a paracentesis removal of 2100 cc of fluid which improved his breathing status.  Treated with empirical antimicrobial therapy with Zosyn and Levaquin.Marland Kitchen  He is a limited CODE BLUE with no intubation no shock or CPR.  Vasoactive medications are allowed.  The patietn was transferred to the ICU at [facility] to the care of PCCM. The patient required pressor support over the course of the last 24 hours. He was transferred to the floor to the care of [facility] on 10/24/2018.  I have discussed this patient with his oncologist Lavera Guise, MD. He tells me that he feels that the patient is doing poorly with his treatment plan. He has just completed 2 cycles of gemcitabine and Abraxane which is palliative treatment following his completion of his radiation therapy. The most recent CT abdomen 10/12/2018 demonstrates dilatation of the biliary duct. It also demonstrated increase in size of the pancreatic mass. There are also new hepatic lesions that are concerning for metastasis. The patient has no prior history of  hepatic mets as per Dr. Bobby Rumpf.   The patient underwent ERCP with Dr. Henrene Pastor on 10/26/2018. The study revealed compression of the distal stomach and duodenum. The previously placed biliary stent was found to be occluded. It was removed and another non-covered self-expanding metal stent was placed. Bile was flowing freely out of the duct at the end of the  procedure.  Consultants   Gastroenterology  Procedures  Paracentesis with removal of 2.1 L at [facility] on 08/23/2018.   Antibiotics   Anti-infectives (From admission, onward)   Start     Dose/Rate Route Frequency Ordered Stop   10/23/18 2245  vancomycin (VANCOCIN) IVPB 750 mg/150 ml premix  Status:  Discontinued     750 mg 150 mL/hr over 60 Minutes Intravenous Every 12 hours 10/23/18 1245 10/24/18 0842   10/23/18 1945  piperacillin-tazobactam (ZOSYN) IVPB 3.375 g     3.375 g 12.5 mL/hr over 240 Minutes Intravenous Every 8 hours 10/23/18 1245     10/23/18 1015  piperacillin-tazobactam (ZOSYN) IVPB 3.375 g     3.375 g 100 mL/hr over 30 Minutes Intravenous NOW 10/23/18 1013 10/23/18 1212   10/23/18 1015  vancomycin (VANCOCIN) IVPB 1000 mg/200 mL premix     1,000 mg 200 mL/hr over 60 Minutes Intravenous NOW 10/23/18 1013 10/23/18 1144      Subjective  The patient is cachectic and frail appearing. He is awake, alert, and oriented x 3.   Objective   Vitals:  Vitals:   10/28/18 1100 10/28/18 1324  BP: 108/78 120/82  Pulse: (!) 117 (!) 126  Resp: 18 18  Temp: 98.7 F (37.1 C) 98.6 F (37 C)  SpO2:  98%    Exam:  Constitutional:   The patient appears frail and chronically ill. He is awake, alert, and oriented x 3. No acute distress. Respiratory:   No increased work of breathing.  No wheezes, rales, or rhonchi.  No tactile fremitus. Cardiovascular:   Regular rate and  rhythm. Tachycardic.  No murmurs, ectopy, or gallups are appreciated.  No lateral PMI. No thrills. Abdomen:   Abdomen is somewhat more distended than yesterday, but non-tender.  Positive fluid wave.  No hernias, masses, or organomegaly.  Hypoactive bowel sounds. Musculoskeletal:   No cyanosis, clubbing.  +2 pitting edema of lower extremities bilaterally. Skin:   No rashes, lesions, ulcers  palpation of skin: no induration or nodules Neurologic:   CN 2-12  intact  Sensation all 4 extremities intact Psychiatric:   Mental status o Mood, affect appropriate o Orientation to person, place, time   judgment and insight appear intact   I have personally reviewed the following:   Today's Data   CMP, CBC, Vitals   Micro Data   1/4 BC from [facility] has gram stain positive for GNR. Blood cultures at [facility] have had no growth.  Scheduled Meds:  feeding supplement (ENSURE ENLIVE)  237 mL Oral TID BM   lipase/protease/amylase  36,000 Units Oral 2 times per day   lipase/protease/amylase  72,000 Units Oral TID WC   methadone  10 mg Oral BID   multivitamin with minerals  1 tablet Oral Daily   naloxegol oxalate  25 mg Oral Daily   OLANZapine  10 mg Oral QHS   pantoprazole  40 mg Oral Q0600   potassium chloride SA  20 mEq Oral Daily   Continuous Infusions:  sodium chloride Stopped (10/28/18 1221)   piperacillin-tazobactam (ZOSYN)  IV 12.5 mL/hr at 10/28/18 1300    Active Problems:   Bile duct obstruction   Sepsis ([place])   Encounter for replacement of biliary stent   Gall stones, common bile duct   LOS: 5 days   A & P   Sepsis from presumed GI source. Resolved.  In the setting of immunocompromised male with pancreatic cancer for 1 year having had past radiation and now currently enrolled in chemotherapy. He has completed 2 cycles of palliative chemotherapy comprised of Abraxane and gemcitabine. The patient was admitted to the ICU to the care of PCCM as he required pressor support. He was transferred to [facility] on 10/24/2018. 1/4 blood cultures from [facility] had gram stain positive for GNR. No growth from any cultures, and no growth from blood cultures drawn at [facility]. The patient receiving Vancomycin and Zosyn at [facility]. Vancomycin has since been discontinued. Lactic acid has come down to 1.3 from 2.3.  Tachycardic: New today. Possibly due to anxiety. Start as needed lorazepam to try to address  anxiety and tachycardia. Would like to avoid beta blocker due to at times marginal blood pressure.  Pancreatic cancer: Now with apparent liver mets and increase in the size of the pancreatic mass per CT abdomen performed on 10/12/2018. He has just completed 2 cycles of gemcitabine and Abraxane which is palliative treatment following his completion of his radiation therapy. Dr. Bobby Rumpf feels that the patient is on a downward trajectory. Despite this the patient has reconsidered his code status and decided to be a full code.  Ascites: 2/1 L drawn off in ED in [facility] ED. Now reaccumulating and abdomen is becoming tense. May need paracetesis tomorrow.  Obstructive jaundice: Concern for obstruction of biliary stent resulting in elelvated LFT's and patient's sepsis. GI has been consulted. ERCP today with removal of prior stent and clearance of obstruction, and placement of new non-covered self-expanding metal stent.   Nausea/vomiting/diarrhea: Advance diet as tolerated. No further diarrhea per patient. Normal BM today.  Failure to thrive: Consult nutrition to  maximize the patient's nutritional intake.  Hypokalemia: Resolved. Due to GI losses. Supplement and monitor.   Anemia of chronic disease/neoplasm: Monitor.  I have seen and examined this patient myself. I have spent 30 minutes in his evaluation and care.    DVT PROPHYLAXIS: Heparin CODE STATUS: Full code FAMILY DISCUSSIONS: No family available. DISPOSITION tbd. Trixy Loyola, DO [facility] Direct contact: see [email or web address]  7PM-7AM contact night coverage as above 10/28/2018, 5:33 PM  LOS: 2 days

## 2018-10-28 NOTE — Progress Notes (Signed)
CROSS COVER [facility]-GI Subjective: [name] is an unfortunate 68 year old white male with a history of advanced pancreatic cancer with a stent exchange on Friday with a metal stent placed after Dr. within an occluded stent .  He is done well since his procedure but has had some low-grade abdominal pain and has had problems with constipation as he has been on narcotics around-the-clock.  He denies having any nausea vomiting and the pain is predominantly periumbilical region.  His appetite has been poor.  Objective: Vital signs in last 24 hours: Temp:  [98.1 F (36.7 C)-98.9 F (37.2 C)] 98.2 F (36.8 C) (08/23 0431) Pulse Rate:  [108-128] 109 (08/23 0431) Resp:  [14-18] 18 (08/23 0431) BP: (99-118)/(71-86) 107/74 (08/23 0431) SpO2:  [93 %-100 %] 97 % (08/23 0431) Weight:  [63.8 kg] 63.8 kg (08/23 0431) Last BM Date: 10/26/18  Intake/Output from previous day: 08/22 0701 - 08/23 0700 In: 1235.9 [P.O.:1070; I.V.:19.1; IV Piggyback:146.8] Out: 900 [Urine:900] Intake/Output this shift: No intake/output data recorded.  General appearance: cooperative, appears older than stated age, cachectic, fatigued, mild distress and pale Resp: clear to auscultation bilaterally Cardio: regular rate and rhythm, S1, S2 normal, no murmur, click, rub or gallop GI: soft, slightly distended with positive fluid wave diffusely tender with hypoactive bowel sounds; no masses,  no organomegaly Extremities: extremities normal, atraumatic, no cyanosis or edema  Lab Results: Recent Labs    10/26/18 0422 10/27/18 0331  WBC 5.1 4.0  HGB 8.4* 7.6*  HCT 23.9* 21.7*  PLT 91* 106*   BMET Recent Labs    10/26/18 0422 10/27/18 0331 10/28/18 0507  NA 134* 135 136  K 3.4* 3.2* 4.4  CL 100 100 103  CO2 28 28 24   GLUCOSE 116* 137* 148*  BUN 8 10 10   CREATININE 0.47* 0.46* 0.47*  CALCIUM 7.7* 7.4* 7.8*   LFT Recent Labs    10/28/18 0507  PROT 5.5*  ALBUMIN 1.7*  AST 57*  ALT 39  ALKPHOS 477*   BILITOT 5.3*   Studies/Results: Dg Ercp  Result Date: 10/26/2018 CLINICAL DATA:  68 year old male with a history of biliary stent EXAM: ERCP TECHNIQUE: Multiple spot images obtained with the fluoroscopic device and submitted for interpretation post-procedure. FLUOROSCOPY TIME:  Fluoroscopy Time:  12 minutes 11 seconds COMPARISON:  May 10, 2018 FINDINGS: Limited intraoperative fluoroscopic spot images during ERCP. Initial image demonstrates endoscope projecting over the upper abdomen, as well as metallic biliary stent in position. Subsequently there is cannulation of the ampulla and partial opacification of the intrahepatic and extrahepatic biliary ducts. Deployment of retrieval balloon and placement of additional metallic stent IMPRESSION: Limited images of ERCP demonstrates partial opacification of the intrahepatic and extrahepatic biliary ducts, deployment of a retrieval balloon, and extension of the biliary stent system. Please refer to the dictated operative report for full details of intraoperative findings and procedure. Electronically Signed   By: Corrie Mckusick D.O.   On: 10/26/2018 11:32   Medications: I have reviewed the patient's current medications.  Assessment/Plan: 1) obstructive jaundice with mass in the pancreatic body with liver mets on recent CT and ascites-metal stent placed on 821 04/07/2018.  Patient seems to have done well after ERCP. 2) drug-induced constipation secondary to narcotics-we will try Movantik. 3) Severe malnutrition with failure to thrive.  Patient's diarrhea has resolved. 4) Anemia of chronic disease. Dr. Norberto Sorenson stop to resume care tomorrow.      LOS: 5 days   Tommy Oconnor 10/28/2018, 8:33 AM

## 2018-10-28 NOTE — Progress Notes (Signed)
Notify provider HR 133-144/min, pt is stable, rest in bed, will give pain med and continue monitor

## 2018-10-28 NOTE — Progress Notes (Signed)
MEWS score 3, due to ST HR 120-130, Paged provider Dr. Karie Kirks, DO.  Patient asymptommatic, checked on pt. HR now 115-120. Paged provider. Awaiting orders.

## 2018-10-28 NOTE — Plan of Care (Signed)

## 2018-10-29 DIAGNOSIS — R945 Abnormal results of liver function studies: Secondary | ICD-10-CM

## 2018-10-29 DIAGNOSIS — C259 Malignant neoplasm of pancreas, unspecified: Secondary | ICD-10-CM

## 2018-10-29 DIAGNOSIS — K831 Obstruction of bile duct: Secondary | ICD-10-CM

## 2018-10-29 DIAGNOSIS — C787 Secondary malignant neoplasm of liver and intrahepatic bile duct: Secondary | ICD-10-CM

## 2018-10-29 LAB — COMPREHENSIVE METABOLIC PANEL
ALT: 41 U/L (ref 0–44)
AST: 51 U/L — ABNORMAL HIGH (ref 15–41)
Albumin: 1.8 g/dL — ABNORMAL LOW (ref 3.5–5.0)
Alkaline Phosphatase: 524 U/L — ABNORMAL HIGH (ref 38–126)
Anion gap: 9 (ref 5–15)
BUN: 13 mg/dL (ref 8–23)
CO2: 25 mmol/L (ref 22–32)
Calcium: 8 mg/dL — ABNORMAL LOW (ref 8.9–10.3)
Chloride: 101 mmol/L (ref 98–111)
Creatinine, Ser: 0.45 mg/dL — ABNORMAL LOW (ref 0.61–1.24)
GFR calc Af Amer: >60 mL/min (ref >60)
GFR calc non Af Amer: >60 mL/min (ref >60)
Glucose, Bld: 173 mg/dL — ABNORMAL HIGH (ref 70–99)
Potassium: 4.4 mmol/L (ref 3.5–5.1)
Sodium: 135 mmol/L (ref 135–145)
Total Bilirubin: 5.6 mg/dL — ABNORMAL HIGH (ref 0.3–1.2)
Total Protein: 6 g/dL — ABNORMAL LOW (ref 6.5–8.1)

## 2018-10-29 LAB — CBC
HCT: 23.8 % — ABNORMAL LOW (ref 39.0–52.0)
Hemoglobin: 7.9 g/dL — ABNORMAL LOW (ref 13.0–17.0)
MCH: 31.1 pg (ref 26.0–34.0)
MCHC: 33.2 g/dL (ref 30.0–36.0)
MCV: 93.7 fL (ref 80.0–100.0)
Platelets: 252 10*3/uL (ref 150–400)
RBC: 2.54 MIL/uL — ABNORMAL LOW (ref 4.22–5.81)
RDW: 17.1 % — ABNORMAL HIGH (ref 11.5–15.5)
WBC: 7.8 10*3/uL (ref 4.0–10.5)
nRBC: 0 % (ref 0.0–0.2)

## 2018-10-29 LAB — CULTURE, BLOOD (ROUTINE X 2)

## 2018-10-29 MED ORDER — SODIUM CHLORIDE 0.9% FLUSH
10.0000 mL | Freq: Two times a day (BID) | INTRAVENOUS | Status: DC
  Administered 2018-10-29: 20 mL
  Administered 2018-10-29 – 2018-11-01 (×4): 10 mL

## 2018-10-29 MED ORDER — SODIUM CHLORIDE 0.9% FLUSH: 10.0000 mL | INTRAVENOUS | Status: DC | PRN

## 2018-10-29 MED ORDER — CHLORHEXIDINE GLUCONATE CLOTH 2 % EX PADS
6.0000 | MEDICATED_PAD | Freq: Every day | CUTANEOUS | Status: DC
  Administered 2018-10-30 – 2018-10-31 (×2): 6 via TOPICAL

## 2018-10-29 NOTE — Plan of Care (Signed)

## 2018-10-29 NOTE — Progress Notes (Addendum)
Pt has full code order in chart, read progress note and pt limited code, paged Dr Benny Lennert to please clarify, she says she corrected her note and pt wanted to be full code

## 2018-10-29 NOTE — Progress Notes (Addendum)
Daily Rounding Note  10/29/2018, 12:41 PM  LOS: 6 days   SUBJECTIVE:   Chief complaint: Metastatic pancreatic cancer.  Occluded biliary stent, status post ERCP with metal stent placed into existing metal stent.  Ascites. Still has discomfort in his upper abdomen, not severe.  Very uncomfortable after no bowel movement for several days, and [place] day 2.  Receiving scheduled methadone, as needed oxycodone.  OBJECTIVE:         Vital signs in last 24 hours:    Temp:  [98 F (36.7 C)-98.7 F (37.1 C)] 98.7 F (37.1 C) (08/24 0447) Pulse Rate:  [120-126] 120 (08/24 0817) Resp:  [18] 18 (08/24 0447) BP: (107-132)/(76-93) 122/93 (08/24 0817) SpO2:  [96 %-98 %] 98 % (08/24 0817) Last BM Date: 10/26/18 Filed Weights   10/26/18 0536 10/27/18 0514 10/28/18 0431  Weight: 62.8 kg 64 kg 63.8 kg   General: Looks chronically unwell.  Currently comfortable.  Not toxic Heart: Tacky, regular. Chest: Diminished breath sounds but clear bilaterally.  No labored breathing or cough. Abdomen: Moderately protrudent and tense.  Mild tenderness across upper abdomen. Extremities: No CCE. Neuro/Psych: Oriented x3.  However his memory is not good.  He is not aware that he is taking pain meds and neither does he recall having had paracentesis last week.  Appropriate.  Subdued affect.  Intake/Output from previous day: 08/23 0701 - 08/24 0700 In: 1207.7 [P.O.:897; I.V.:34.3; IV Piggyback:276.5] Out: 200 [Urine:200]  Intake/Output this shift: Total I/O In: 39.9 [I.V.:39.9] Out: -   Lab Results: Recent Labs    10/27/18 0331 10/29/18 1044  WBC 4.0 7.8  HGB 7.6* 7.9*  HCT 21.7* 23.8*  PLT 106* 252   BMET Recent Labs    10/27/18 0331 10/28/18 0507 10/29/18 1044  NA 135 136 135  K 3.2* 4.4 4.4  CL 100 103 101  CO2 28 24 25   GLUCOSE 137* 148* 173*  BUN 10 10 13   CREATININE 0.46* 0.47* 0.45*  CALCIUM 7.4* 7.8* 8.0*   LFT Recent  Labs    10/27/18 0331 10/28/18 0507 10/29/18 1044  PROT 4.8* 5.5* 6.0*  ALBUMIN 1.6* 1.7* 1.8*  AST 41 57* 51*  ALT 39 39 41  ALKPHOS 425* 477* 524*  BILITOT 6.8* 5.3* 5.6*   PT/INR No results for input(s): LABPROT, INR in the last 72 hours. Hepatitis Panel No results for input(s): HEPBSAG, HCVAB, HEPAIGM, HEPBIGM in the last 72 hours.  Studies/Results: No results found.  ASSESMENT:   *     Advanced, metastatic pancreatic cancer. 10/27/2018 ERCP revealing advancement of cancer with resulting occlusion of existing metal stent.  Tumor also compressing distal stomach and duodenum.  Sludge and debris extracted and a new 10 mm, 8 sonometer self-expanding metal stent placed. T bili 6.8 >> 5.6.  Alkaline phosphatase 425 >> 524.  AST/ALT WNL Growing coag negative staph from 1 of  2 blood cultures 1 week ago.  Blood culture ID panel PCR:   No pathogens detected CTAP at [facility] with new liver lesions, new ascites, enlarging left lund and new right lung nodules.   Day 7 Zosyn.    *   Ascites.  2.1 liter tap of ascitic fluid at [facility]  ~ 8/18.  Fluid not sent for cytology and was neg for SBP.    *     Chronic anemia.  Normocytic.  Hgb 9.8 >> 7.9.  *     Thrombocytopenia, 66.   Now normal.  *  Coagulopathy.  INR 2 last week.  PLAN   *   ? How long to continue Zosyn?  *   Given patient's overall poor prognosis, please consider palliative care, goals of care consult.  I would think the patient could qualify for hospice care at home.  *    Recommend repeat paracentesis and send fluid for cytology.   Azucena Freed  10/29/2018, 12:41 PM Phone [phone]    Attending Physician Note   I have taken an interval history, reviewed the chart and examined the patient. I agree with the Advanced Practitioner's note, impression and recommendations.   * Advanced metastatic pancreatic cancer * Biliary occlusion with tumor, sludge, debris cleared and an additional SEMS placed  on 8/21 however LFTs remain elevated possibly due to liver mets, biliary tumor burden * Suspected cholangitis * Increasing ascites contributing to abdominal pain * Constipation   No additional endoscopic intervention is planned Antibiotics for 7 days from a biliary standpoint Recommend repeat paracentesis for symptom relief and for cytology Recommend palliative care consult, discuss [place]  GI signing off, available if needed  Lucio Edward, MD [facility]

## 2018-10-29 NOTE — Progress Notes (Signed)
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  He is completed radiation therapy.  Currently receiving chemotherapy from an oncologist in [facility].  He reported [facility] 24 hours ago with a 1 day complaint of nausea vomiting diarrhea.  Found to be hypertensive and difficulty breathing.  He underwent a paracentesis removal of 2100 cc of fluid which improved his breathing status.  Treated with empirical antimicrobial therapy with Zosyn and Levaquin.Marland Kitchen  He is a limited CODE BLUE with no intubation no shock or CPR.  Vasoactive medications are allowed.  The patietn was transferred to the ICU at [facility] to the care of PCCM. The patient required pressor support over the course of the last 24 hours. He was transferred to the floor to the care of [facility] on 10/24/2018.  I have discussed this patient with his oncologist Lavera Guise, MD. He tells me that he feels that the patient is doing poorly with his treatment plan. He has just completed 2 cycles of gemcitabine and Abraxane which is palliative treatment following his completion of his radiation therapy. The most recent CT abdomen 10/12/2018 demonstrates dilatation of the biliary duct. It also demonstrated increase in size of the pancreatic mass. There are also new hepatic lesions that are concerning for metastasis. The patient has no prior history of  hepatic mets as per Dr. Bobby Rumpf.   The patient underwent ERCP with Dr. Henrene Pastor on 10/26/2018. The study revealed compression of the distal stomach and duodenum. The previously placed biliary stent was found to be occluded. It was removed and another non-covered self-expanding metal stent was placed. Bile was flowing freely out of the duct at the end of the  procedure.  The patient is continuing to accumulate fluid in his abdomen. I will send for paracentesis tomorrow.  Consultants   Gastroenterology  Procedures  Paracentesis with removal of 2.1 L at [facility] on 08/23/2018.   Antibiotics   Anti-infectives (From admission, onward)   Start     Dose/Rate Route Frequency Ordered Stop   10/23/18 2245  vancomycin (VANCOCIN) IVPB 750 mg/150 ml premix  Status:  Discontinued     750 mg 150 mL/hr over 60 Minutes Intravenous Every 12 hours 10/23/18 1245 10/24/18 0842   10/23/18 1945  piperacillin-tazobactam (ZOSYN) IVPB 3.375 g     3.375 g 12.5 mL/hr over 240 Minutes Intravenous Every 8 hours 10/23/18 1245     10/23/18 1015  piperacillin-tazobactam (ZOSYN) IVPB 3.375 g     3.375 g 100 mL/hr over 30 Minutes Intravenous NOW 10/23/18 1013 10/23/18 1212   10/23/18 1015  vancomycin (VANCOCIN) IVPB 1000 mg/200 mL premix     1,000 mg 200 mL/hr over 60 Minutes Intravenous NOW 10/23/18 1013 10/23/18 1144      Subjective  The patient is cachectic and frail appearing. He is awake, alert, and oriented x 3.   Objective   Vitals:  Vitals:   10/29/18 0817 10/29/18 1326  BP: (!) 122/93 118/82  Pulse: (!) 120 (!) 120  Resp:  18  Temp:  98.9 F (37.2 C)  SpO2: 98% 98%    Exam:  Constitutional:   The patient appears frail and chronically ill. He is awake, alert, and oriented x 3. No acute distress. Respiratory:   No increased work of breathing.  No wheezes, rales, or rhonchi.  No tactile fremitus. Cardiovascular:   Regular rate and rhythm. Tachycardic.  No murmurs, ectopy, or gallups are appreciated.  No lateral PMI. No thrills. Abdomen:   Abdomen is a little less distended than yesterday, but non-tender.  Positive fluid wave.  No hernias, masses, or organomegaly.  Hypoactive bowel sounds. Musculoskeletal:   No cyanosis, clubbing.  +1 pitting edema of lower extremities bilaterally. Skin:   No rashes, lesions,  ulcers  palpation of skin: no induration or nodules Neurologic:   CN 2-12 intact  Sensation all 4 extremities intact Psychiatric:   Mental status o Mood, affect appropriate o Orientation to person, place, time   judgment and insight appear intact   I have personally reviewed the following:   Today's Data   CMP, CBC, Vitals  Micro Data   1/4 BC from [facility] has gram stain positive for GNR. Blood cultures at [facility] have had no growth.  Scheduled Meds:  feeding supplement (ENSURE ENLIVE)  237 mL Oral TID BM   lipase/protease/amylase  36,000 Units Oral 2 times per day   lipase/protease/amylase  72,000 Units Oral TID WC   methadone  10 mg Oral BID   multivitamin with minerals  1 tablet Oral Daily   naloxegol oxalate  25 mg Oral Daily   OLANZapine  10 mg Oral QHS   pantoprazole  40 mg Oral Q0600   potassium chloride SA  20 mEq Oral Daily   sodium chloride flush  10-40 mL Intracatheter Q12H   Continuous Infusions:  sodium chloride Stopped (10/29/18 0416)   piperacillin-tazobactam (ZOSYN)  IV Stopped (10/29/18 1652)    Active Problems:   Bile duct obstruction   Sepsis ([place])   Encounter for replacement of biliary stent   Gall stones, common bile duct   LOS: 6 days   A & P   Sepsis from presumed GI source. Resolved.  In the setting of immunocompromised male with pancreatic cancer for 1 year having had past radiation and now currently enrolled in chemotherapy. He has completed 2 cycles of palliative chemotherapy comprised of Abraxane and gemcitabine. The patient was admitted to the ICU to the care of PCCM as he required pressor support. He was transferred to [facility] on 10/24/2018. 1/4 blood cultures from [facility] had gram stain positive for GNR. No growth from any cultures, and no growth from blood cultures drawn at [facility]. The patient receiving Vancomycin and Zosyn at [facility]. Vancomycin has since been discontinued. Lactic acid  has come down to 1.3 from 2.3.  Tachycardic: New today. Possibly due to anxiety. Start as needed lorazepam to try to address anxiety and tachycardia. Would like to avoid beta blocker due to at times marginal blood pressure.  Pancreatic cancer: Now with apparent liver mets and increase in the size of the pancreatic mass per CT abdomen performed on 10/12/2018. He has just completed 2 cycles of gemcitabine and Abraxane which is palliative treatment following his completion of his radiation therapy. Dr. Bobby Rumpf feels that the patient is on a downward trajectory. Despite this the patient has reconsidered his code status and decided to be a full code.  Ascites: 2/1 L drawn off in ED in [facility] ED. Now reaccumulating and abdomen is becoming tense. Will send for paracentesis tomorrow.  Obstructive jaundice: Concern for obstruction of biliary stent resulting in elelvated LFT's and patient's sepsis. GI has been consulted. ERCP today with removal of prior stent and clearance of obstruction, and placement of new non-covered self-expanding  metal stent.   Nausea/vomiting/diarrhea: Advance diet as tolerated. No further diarrhea per patient. Normal BM today.  Failure to thrive: Consult nutrition to maximize the patient's nutritional intake.  Hypokalemia: Resolved. Due to GI losses. Supplement and monitor.   Anemia of chronic disease/neoplasm: Monitor.  I have seen and examined this patient myself. I have spent 32 minutes in his evaluation and care.    DVT PROPHYLAXIS: Heparin CODE STATUS: Full code FAMILY DISCUSSIONS: No family available. DISPOSITION tbd. Alexandre Lightsey, DO [facility] Direct contact: see [email or web address]  7PM-7AM contact night coverage as above 10/29/2018, 5:39 PM  LOS: 2 days

## 2018-10-29 NOTE — Care Management Important Message (Signed)
Important Message  Patient Details  Name: Tommy Oconnor MRN: [id number] Date of Birth: [birth year]   Medicare Important Message Given:  Yes     Shelda Altes 10/29/2018, 12:57 PM

## 2018-10-29 NOTE — Progress Notes (Signed)
Pt has had ST since admit, pt asx, resting in bed, MD aware

## 2018-10-29 NOTE — Progress Notes (Signed)
   Vital Signs MEWS/VS Documentation      10/29/2018 1326 10/29/2018 1900 10/29/2018 1934 10/29/2018 2338   MEWS Score:  2  2  2  2    MEWS Score Color:  Yellow  Yellow  Yellow  Yellow   Resp:  18  -  18  -   Pulse:  (!) 120  -  (!) 116  -   BP:  118/82  -  121/81  -   Temp:  98.9 F (37.2 C)  -  98.3 F (36.8 C)  -   O2 Device:  Room Air  -  Room Air  -   Level of Consciousness:  -  -  -  Alert     Not an acute change. Will monitor      Tanya Nones D Lolita Faulds 10/29/2018,11:57 PM

## 2018-10-29 NOTE — Progress Notes (Signed)
Pt has been tachycardia since admit with sepsis, pt  asx, hr 110-120. MD is aware

## 2018-10-30 ENCOUNTER — Encounter (HOSPITAL_COMMUNITY): Payer: Self-pay | Admitting: Physician Assistant

## 2018-10-30 ENCOUNTER — Inpatient Hospital Stay (HOSPITAL_COMMUNITY): Payer: Medicare Other

## 2018-10-30 HISTORY — PX: IR PARACENTESIS: IMG2679

## 2018-10-30 LAB — ALBUMIN, PLEURAL OR PERITONEAL FLUID: Albumin, Fluid: <1 g/dL

## 2018-10-30 LAB — BODY FLUID CELL COUNT WITH DIFFERENTIAL
Lymphs, Fluid: 17 %
Monocyte-Macrophage-Serous Fluid: 54 % (ref 50–90)
Neutrophil Count, Fluid: 29 % — ABNORMAL HIGH (ref 0–25)
Total Nucleated Cell Count, Fluid: 225 cu mm (ref 0–1000)

## 2018-10-30 LAB — GLUCOSE, PLEURAL OR PERITONEAL FLUID: Glucose, Fluid: 163 mg/dL

## 2018-10-30 LAB — GRAM STAIN

## 2018-10-30 LAB — LACTATE DEHYDROGENASE, PLEURAL OR PERITONEAL FLUID: LD, Fluid: 53 U/L — ABNORMAL HIGH (ref 3–23)

## 2018-10-30 LAB — AMYLASE, PLEURAL OR PERITONEAL FLUID: Amylase, Fluid: <5 U/L

## 2018-10-30 MED ORDER — LIDOCAINE HCL 1 % IJ SOLN
INTRAMUSCULAR | Status: AC
  Filled 2018-10-30: qty 20

## 2018-10-30 MED ORDER — ALBUMIN HUMAN 25 % IV SOLN
50.0000 g | Freq: Once | INTRAVENOUS | Status: AC
  Administered 2018-10-30: 11:00:00 50 g via INTRAVENOUS
  Filled 2018-10-30: qty 200

## 2018-10-30 MED ORDER — SODIUM CHLORIDE 0.9 % IV SOLN
2.0000 g | INTRAVENOUS | Status: DC
  Administered 2018-10-30 – 2018-11-01 (×3): 2 g via INTRAVENOUS
  Filled 2018-10-30 (×2): qty 2
  Filled 2018-10-30: qty 20

## 2018-10-30 MED ORDER — LIDOCAINE HCL (PF) 1 % IJ SOLN
INTRAMUSCULAR | Status: AC | PRN
  Administered 2018-10-30: 10 mL

## 2018-10-30 NOTE — Plan of Care (Signed)
  Problem: Clinical Measurements: Goal: Will remain free from infection Outcome: Progressing   Problem: Activity: Goal: Risk for activity intolerance will decrease Outcome: Progressing   Problem: Safety: Goal: Ability to remain free from injury will improve Outcome: Progressing   

## 2018-10-30 NOTE — Procedures (Signed)
PROCEDURE SUMMARY:  Successful US guided paracentesis from left lateral abdomen.  Yielded 5.5 liters of clear yellow fluid.  No immediate complications.  Patient tolerated well.  EBL = trace  Specimen was sent for labs.  Judie Grieve Thaddeus Evitts PA-C 10/30/2018 12:42 PM

## 2018-10-30 NOTE — Progress Notes (Signed)
Nutrition Follow-up  DOCUMENTATION CODES:   Severe malnutrition in context of chronic illness  INTERVENTION:   -Continue Ensure Enlive po TID, each supplement provides 350 kcal and 20 grams of protein -Continue MVI with minerals daily -Continue Magic cup TID with meals, each supplement provides 290 kcal and 9 grams of protein  NUTRITION DIAGNOSIS:   Severe Malnutrition related to chronic illness(pancreatic cancer) as evidenced by moderate fat depletion, severe fat depletion, moderate muscle depletion, severe muscle depletion.  Ongoing  GOAL:   Patient will meet greater than or equal to 90% of their needs  Progressing   MONITOR:   PO intake, Supplement acceptance, Labs, Weight trends, Skin, I & O's  REASON FOR ASSESSMENT:   Consult Assessment of nutrition requirement/status  ASSESSMENT:   68 year old with a history of pancreatic cancer x1 year admitted with sepsis.  8/21- s/p ERCP, which revealed compression of the distal stomach and duodenum by the tumor and bstructed biliary stent status post biliary debris and sludge extraction followed by placement of noncovered self-expanding metal stent (10 mm diameter, 8 cm length) 8/25- s/p lt paracentesis (5.5 L clear yellow fluid removed)  Reviewed I/O's: +264 ml x 24 hours and +4.5 L since admission  UOP: 100 ml x 24 hours  Pt's intake has been variable' noted PO 20-100%. Pt has been accepting Ensure supplements per MAR.   Noted wt increase over the weekend, however, suspect this may be due to increased fluid prior to paracentesis.   Reviewed GI notes, recommending palliative care consult.   Labs reviewed.   Diet Order:   Diet Order            Diet regular Room service appropriate? Yes; Fluid consistency: Thin  Diet effective now              EDUCATION NEEDS:   Education needs have been addressed  Skin:  Skin Assessment: Reviewed RN Assessment  Last BM:  10/30/18  Height:   Ht Readings from Last 1  Encounters:  10/25/18 5\' 8"  (1.727 m)    Weight:   Wt Readings from Last 1 Encounters:  10/30/18 65.2 kg    Ideal Body Weight:  70 kg  BMI:  Body mass index is 21.86 kg/m.  Estimated Nutritional Needs:   Kcal:  2000-2200  Protein:  95-110 grams  Fluid:  > 2 L    Roselin Wiemann A. Jimmye Norman, RD, LDN, [place] Registered Dietitian II Certified Diabetes Care and Education Specialist Pager: [phone] After hours Pager: [phone]

## 2018-10-30 NOTE — Progress Notes (Signed)
Doing rounds and stopped by Tommy Oconnor room.  Asked if Tommy Oconnor would like prayer and he accepted prayer.  Tommy Oconnor wanted prayer for healing. He said some family would be to see him tomorrow, and was happy about that.

## 2018-10-30 NOTE — Progress Notes (Signed)
PROGRESS NOTE  Tommy Oconnor [id number] DOB: [birth year] DOA: 10/23/2018 PCP: [name], PA-C  Brief History   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  He is completed radiation therapy.  Currently receiving chemotherapy from an oncologist in [facility].  He reported [facility] 24 hours ago with a 1 day complaint of nausea vomiting diarrhea.  Found to be hypertensive and difficulty breathing.  He underwent a paracentesis removal of 2100 cc of fluid which improved his breathing status.  Treated with empirical antimicrobial therapy with Zosyn and Levaquin.Marland Kitchen  He is full code.  Vasoactive medications are allowed.  The patietn was transferred to the ICU at [facility] to the care of PCCM. The patient required pressor support over the course of the last 24 hours. He was transferred to the floor to the care of [facility] on 10/24/2018.  I have discussed this patient with his oncologist Tommy Guise, MD. He tells me that he feels that the patient is doing poorly with his treatment plan. He has just completed 2 cycles of gemcitabine and Abraxane which is palliative treatment following his completion of his radiation therapy. The most recent CT abdomen 10/12/2018 demonstrates dilatation of the biliary duct. It also demonstrated increase in size of the pancreatic mass. There are also new hepatic lesions that are concerning for metastasis. The patient has no prior history of  hepatic mets as per Dr. Bobby Oconnor.   The patient underwent ERCP with Dr. Henrene Oconnor on 10/26/2018. The study revealed compression of the distal stomach and duodenum. The previously placed biliary stent was found to be occluded. It was removed and another non-covered self-expanding metal stent was placed. Bile was flowing freely out of the duct at the end of the procedure.  The patient is continuing to accumulate  fluid in his abdomen. I will send for paracentesis tomorrow.  Consultants  . Gastroenterology  Procedures  Paracentesis with removal of 2.1 L at [facility] on 08/23/2018.   Antibiotics   Anti-infectives (From admission, onward)   Start     Dose/Rate Route Frequency Ordered Stop   10/30/18 0945  cefTRIAXone (ROCEPHIN) 2 g in sodium chloride 0.9 % 100 mL IVPB     2 g 200 mL/hr over 30 Minutes Intravenous Every 24 hours 10/30/18 0932     10/23/18 2245  vancomycin (VANCOCIN) IVPB 750 mg/150 ml premix  Status:  Discontinued     750 mg 150 mL/hr over 60 Minutes Intravenous Every 12 hours 10/23/18 1245 10/24/18 0842   10/23/18 1945  piperacillin-tazobactam (ZOSYN) IVPB 3.375 g  Status:  Discontinued     3.375 g 12.5 mL/hr over 240 Minutes Intravenous Every 8 hours 10/23/18 1245 10/30/18 0935   10/23/18 1015  piperacillin-tazobactam (ZOSYN) IVPB 3.375 g     3.375 g 100 mL/hr over 30 Minutes Intravenous NOW 10/23/18 1013 10/23/18 1212   10/23/18 1015  vancomycin (VANCOCIN) IVPB 1000 mg/200 mL premix     1,000 mg 200 mL/hr over 60 Minutes Intravenous NOW 10/23/18 1013 10/23/18 1144      Subjective  The patient is cachectic and frail appearing. He is awake, alert, and oriented x 3.   Objective   Vitals:  Vitals:   10/30/18 0517 10/30/18 1238  BP: 108/75 101/69  Pulse: (!) 114 (!) 105  Resp: 18 18  Temp: 98.5 F (36.9 C) 99 F (37.2 C)  SpO2: 97% 99%  Exam:  Constitutional:  . The patient appears frail and chronically ill. He is awake, alert, and oriented x 3. No acute distress. Respiratory:  . No increased work of breathing. . No wheezes, rales, or rhonchi. . No tactile fremitus. Cardiovascular:  . Regular rate and rhythm. Tachycardic. Marland Kitchen No murmurs, ectopy, or gallups are appreciated. . No lateral PMI. No thrills. Abdomen:  . Abdomen is a little less distended than yesterday, but non-tender. Marland Kitchen Positive fluid wave. . No hernias, masses, or organomegaly. .  Hypoactive bowel sounds. Musculoskeletal:  . No cyanosis, clubbing. . +1 pitting edema of lower extremities bilaterally. Skin:  . No rashes, lesions, ulcers . palpation of skin: no induration or nodules Neurologic:  . CN 2-12 intact . Sensation all 4 extremities intact Psychiatric:  . Mental status o Mood, affect appropriate o Orientation to person, place, time  . judgment and insight appear intact   I have personally reviewed the following:   Today's Data  . CMP, CBC, Vitals  Micro Data  . 1/4 BC from [facility] has gram stain positive for GNR. Blood cultures at [facility] have had no growth. Positive for coag negative staph.  Scheduled Meds: . Chlorhexidine Gluconate Cloth  6 each Topical Daily  . feeding supplement (ENSURE ENLIVE)  237 mL Oral TID BM  . lidocaine      . lipase/protease/amylase  36,000 Units Oral 2 times per day  . lipase/protease/amylase  72,000 Units Oral TID WC  . methadone  10 mg Oral BID  . multivitamin with minerals  1 tablet Oral Daily  . naloxegol oxalate  25 mg Oral Daily  . OLANZapine  10 mg Oral QHS  . pantoprazole  40 mg Oral Q0600  . potassium chloride SA  20 mEq Oral Daily  . sodium chloride flush  10-40 mL Intracatheter Q12H   Continuous Infusions: . sodium chloride Stopped (10/29/18 0416)  . cefTRIAXone (ROCEPHIN)  IV Stopped (10/30/18 1309)    Active Problems:   Bile duct obstruction   Sepsis ([place])   Encounter for replacement of biliary stent   Gall stones, common bile duct   LOS: 7 days   A & P   Sepsis from presumed GI source. Resolved.  In the setting of immunocompromised male with pancreatic cancer for 1 year having had past radiation and now currently enrolled in chemotherapy. He has completed 2 cycles of palliative chemotherapy comprised of Abraxane and gemcitabine. The patient was admitted to the ICU to the care of PCCM as he required pressor support. He was transferred to [facility] on 10/24/2018. 1/4 blood  cultures from [facility] had gram stain positive for GNR. No growth from any cultures, and no growth from blood cultures drawn at [facility]. The patient receiving Vancomycin and Zosyn at [facility]. Vancomycin has since been discontinued. Lactic acid has come down to 1.3 from 2.3.  Tachycardic: New today. Possibly due to anxiety. Start as needed lorazepam to try to address anxiety and tachycardia. Would like to avoid beta blocker due to at times marginal blood pressure.  Pancreatic cancer: Now with apparent liver mets and increase in the size of the pancreatic mass per CT abdomen performed on 10/12/2018. He has just completed 2 cycles of gemcitabine and Abraxane which is palliative treatment following his completion of his radiation therapy. Dr. Bobby Oconnor feels that the patient is on a downward trajectory. Despite this the patient has reconsidered his code status and decided to be a full code.  Ascites: 2/1 L drawn off  in ED in [place] ED. Now reaccumulating and abdomen is becoming tense. Will send for paracentesis tomorrow.  Obstructive jaundice: Concern for obstruction of biliary stent resulting in elelvated LFT's and patient's sepsis. GI has been consulted. ERCP today with removal of prior stent and clearance of obstruction, and placement of new non-covered self-expanding metal stent.   Nausea/vomiting/diarrhea: Advance diet as tolerated. No further diarrhea per patient. Normal BM today.  Failure to thrive: Consult nutrition to maximize the patient's nutritional intake.  Hypokalemia: Resolved. Due to GI losses. Supplement and monitor.   Anemia of chronic disease/neoplasm: Monitor.  I have seen and examined this patient myself. I have spent 35 minutes in his evaluation and care.    DVT PROPHYLAXIS: Heparin CODE STATUS: Full code FAMILY DISCUSSIONS: No family available. DISPOSITION tbd. Tryce Surratt, DO [facility] Direct contact: see [email or web address]  7PM-7AM contact night  coverage as above 10/30/2018, 1:57 PM  LOS: 2 days

## 2018-10-30 NOTE — Progress Notes (Signed)
Pt to IR in stable condition, spoke to sarah in IR and advised he was on 3rd bottle of 4 albumin, she said they can hang the 4th one and to send him down, originally told transport was to be at 11:30  With albumin on call,

## 2018-10-31 LAB — BASIC METABOLIC PANEL
Anion gap: 7 (ref 5–15)
BUN: 11 mg/dL (ref 8–23)
CO2: 25 mmol/L (ref 22–32)
Calcium: 7.9 mg/dL — ABNORMAL LOW (ref 8.9–10.3)
Chloride: 101 mmol/L (ref 98–111)
Creatinine, Ser: 0.5 mg/dL — ABNORMAL LOW (ref 0.61–1.24)
GFR calc Af Amer: >60 mL/min (ref >60)
GFR calc non Af Amer: >60 mL/min (ref >60)
Glucose, Bld: 157 mg/dL — ABNORMAL HIGH (ref 70–99)
Potassium: 3.8 mmol/L (ref 3.5–5.1)
Sodium: 133 mmol/L — ABNORMAL LOW (ref 135–145)

## 2018-10-31 LAB — PH, BODY FLUID: pH, Body Fluid: 7.9

## 2018-10-31 NOTE — Plan of Care (Signed)
  Problem: Education: Goal: Knowledge of General Education information will improve Description: Including pain rating scale, medication(s)/side effects and non-pharmacologic comfort measures 10/31/2018 2255 by Nelia Shi, RN Outcome: Progressing 10/31/2018 2255 by Nelia Shi, RN Outcome: Progressing

## 2018-10-31 NOTE — Progress Notes (Signed)
Pt remains with st , pt asx, MD aware

## 2018-10-31 NOTE — Progress Notes (Addendum)
MEWS 3, pt bp soft and still tachy, Dr Benny Lennert was in [place] and aware, to monitor as pt had paracentesis yesterday with large amount of fluid removed

## 2018-10-31 NOTE — TOC Progression Note (Signed)
Transition of Care [facility]) - Progression Note    Patient Details  Name: Rashad Perlow MRN: [id number] Date of Birth: 01/31/1951  Transition of [facility] CM/SW Contact  Zenon Mayo, RN Phone Number: 10/31/2018, 8:41 AM  Clinical Narrative:    [facility] team continues to follow for TOC needs.        Expected Discharge Plan and Services                                                 Social Determinants of Health (SDOH) Interventions    Readmission Risk Interventions No flowsheet data found.

## 2018-10-31 NOTE — Evaluation (Signed)
Physical Therapy Evaluation Patient Details Name: Tommy Oconnor MRN: [id number] DOB: 02-21-1951 Today's Date: 10/31/2018   History of Present Illness  Patient is a 68 year old male diagnosed with pancreatic cancer about a year ago. Reported nausea/vomiting diahrrea. s/p ERCP to clear out bile duct and stent placement. Patient also s/p paracentesis. PMH to include HTN.  Clinical Impression  Patient received in bed, agrees to PT evaluation. Very pleasant. Patient requires supervision for bed mobility that he performed with modified independence. Performed sit >stand transfer with min guard for initial standing balance. Patient was able to ambulate 150 feet, no AD. Slow, cautious, rigid gait pattern. Small steps with narrow base of support. Patient will benefit from continued skilled PT acutely to improve independence with mobility, improve strength and to improve activity tolerance for return home.       Follow Up Recommendations Home health PT    Equipment Recommendations  Rolling walker with 5" wheels    Recommendations for Other Services       Precautions / Restrictions Precautions Precautions: Fall Precaution Comments: mod fall Restrictions Weight Bearing Restrictions: No      Mobility  Bed Mobility Overal bed mobility: Modified Independent             General bed mobility comments: increased effort, use of rails, increased time  Transfers Overall transfer level: Needs assistance Equipment used: Rolling walker (2 wheeled) Transfers: Sit to/from Stand Sit to Stand: Min guard         General transfer comment: min guard for initial standing balance  Ambulation/Gait Ambulation/Gait assistance: Min guard Gait Distance (Feet): 150 Feet Assistive device: None Gait Pattern/deviations: Shuffle;Decreased step length - right;Decreased step length - left;Step-through pattern Gait velocity: decreased   General Gait Details: very cautious, small steps, rigid  posture  Stairs            Wheelchair Mobility    Modified Rankin (Stroke Patients Only)       Balance Overall balance assessment: Needs assistance;Mild deficits observed, not formally tested Sitting-balance support: Feet supported Sitting balance-Leahy Scale: Good     Standing balance support: No upper extremity supported Standing balance-Leahy Scale: Fair                               Pertinent Vitals/Pain Pain Assessment: No/denies pain    Home Living Family/patient expects to be discharged to:: Private residence Living Arrangements: Spouse/significant other Available Help at Discharge: Family;Available 24 hours/day Type of Home: House Home Access: Level entry     Home Layout: One level Home Equipment: None      Prior Function Level of Independence: Independent               Hand Dominance        Extremity/Trunk Assessment   Upper Extremity Assessment Upper Extremity Assessment: Generalized weakness    Lower Extremity Assessment Lower Extremity Assessment: Generalized weakness    Cervical / Trunk Assessment Cervical / Trunk Assessment: Normal  Communication   Communication: No difficulties  Cognition Arousal/Alertness: Awake/alert Behavior During Therapy: WFL for tasks assessed/performed Overall Cognitive Status: Within Functional Limits for tasks assessed                                        General Comments      Exercises Other Exercises Other Exercises: seated LAQ, marching, supine  SLR, hip abd/add x 10 reps each bilaterally.   Assessment/Plan    PT Assessment Patient needs continued PT services  PT Problem List Decreased strength;Decreased mobility;Decreased activity tolerance;Decreased balance       PT Treatment Interventions Therapeutic activities;Therapeutic exercise;Gait training;Functional mobility training;Balance training;Patient/family education    PT Goals (Current goals can be  found in the Care Plan section)  Acute Rehab PT Goals Patient Stated Goal: to return home PT Goal Formulation: With patient Time For Goal Achievement: 11/07/18 Potential to Achieve Goals: Good    Frequency Min 3X/week   Barriers to discharge        Co-evaluation               AM-PAC PT "6 Clicks" Mobility  Outcome Measure Help needed turning from your back to your side while in a flat bed without using bedrails?: A Little Help needed moving from lying on your back to sitting on the side of a flat bed without using bedrails?: A Little Help needed moving to and from a bed to a chair (including a wheelchair)?: A Little Help needed standing up from a chair using your arms (e.g., wheelchair or bedside chair)?: A Little Help needed to walk in hospital room?: A Little Help needed climbing 3-5 steps with a railing? : A Little 6 Click Score: 18    End of Session Equipment Utilized During Treatment: Gait belt Activity Tolerance: Patient tolerated treatment well Patient left: in bed;with call bell/phone within reach Nurse Communication: Mobility status;Other (comment)(pill found in bed) PT Visit Diagnosis: Unsteadiness on feet (R26.81);Muscle weakness (generalized) (M62.81);Difficulty in walking, not elsewhere classified (R26.2);Other abnormalities of gait and mobility (R26.89)    Time: 1420-1445 PT Time Calculation (min) (ACUTE ONLY): 25 min   Charges:   PT Evaluation $PT Eval Low Complexity: 1 Low PT Treatments $Gait Training: 8-22 mins        Tarynn Garling, PT, GCS 10/31/18,3:51 PM

## 2018-10-31 NOTE — Progress Notes (Signed)
Pt has lower bp at times and st since admission, hr improved at 108, MD aware pt has had mews of 2

## 2018-10-31 NOTE — Plan of Care (Signed)
  Problem: Education: Goal: Knowledge of General Education information will improve Description Including pain rating scale, medication(s)/side effects and non-pharmacologic comfort measures Outcome: Progressing   

## 2018-11-01 MED ORDER — ADULT MULTIVITAMIN W/MINERALS CH: 1.0000 | ORAL_TABLET | Freq: Every day | ORAL | 0 refills | Status: AC

## 2018-11-01 MED ORDER — PANCRELIPASE (LIP-PROT-AMYL) 12000-38000 UNITS PO CPEP
72000.0000 [IU] | ORAL_CAPSULE | Freq: Three times a day (TID) | ORAL | Status: DC
  Administered 2018-11-01 (×2): 72000 [IU] via ORAL
  Filled 2018-11-01 (×2): qty 6

## 2018-11-01 MED ORDER — NALOXEGOL OXALATE 25 MG PO TABS: 25.0000 mg | ORAL_TABLET | Freq: Every day | ORAL | 0 refills | Status: AC

## 2018-11-01 MED ORDER — ENSURE ENLIVE PO LIQD: 237.0000 mL | Freq: Three times a day (TID) | ORAL | 12 refills | Status: AC

## 2018-11-01 NOTE — Progress Notes (Signed)
PIV removed with catheter intact dry dressing applied. Tele removed and CCMD informed of removal. Patient instructed call for his ride home. Discharge instructions given.

## 2018-11-01 NOTE — Plan of Care (Signed)

## 2018-11-01 NOTE — Discharge Summary (Signed)
Physician Discharge Summary  Tommy Oconnor [id number] DOB: 06-29-1950 DOA: 10/23/2018  PCP: [name], PA-C  Admit date: 10/23/2018 Discharge date: 11/01/2018  Recommendations for Outpatient Follow-up:  1. The patient is to follow up with his PCP in 7-10 days. 2. He is to follow up with Dr. Bobby Rumpf as directed. Keep appointment. 3. The patient will have home health PT/OT/ and RN services at home.  Follow-up Information    O'Buch, Rosie Fate On 11/01/2018.   Specialty: Internal Medicine Why: Office will call patient with appointment Contact information: [place] [address] [phone]        Home, [facility] Follow up.   Specialty: Home Health Services Why: [facility] will provide PT/OT/RN and aide services, they will call you to schedule day and time.  Contact information: [address] [place] [address] [phone]            Discharge Diagnoses: Principal diagnosis is #1 1. Sepsis from a presumed GI source related to biliary stent occlusion 2. Tachycardia 3. Pancreatic cancer with mets to liver 4. Ascites 5. Obstructive jaundice. Now s/p replacement of biliary stent. 6. Failure to thrive 7. Hypokalemia 8. Anemia of Chronic disease 9. Nausea vomiting diarrhea  Discharge Condition: Poor Disposition: Home with home health  Diet recommendation: Heart healthy  Filed Weights   10/30/18 0517 10/31/18 0454 11/01/18 0647  Weight: 65.2 kg 60.1 kg 59.4 kg    History of present illness:  Tommy Oconnor is a 68 year old male formerly employed as a Games developer never smoker never drinker he was diagnosed with pancreatic cancer approximately 1 year ago.  He is completed radiation therapy.  Currently receiving chemotherapy from an oncologist in [facility]  He reported [facility] 24 hours ago with a 1 day complaint of nausea vomiting diarrhea.  Found to be hypertensive and difficulty breathing.  He underwent a  paracentesis removal of 2100 cc of fluid which improved his breathing status.  Treated with empirical antimicrobial therapy with Zosyn and Levaquin.Marland Kitchen  He is a limited CODE BLUE with no intubation no shock or CPR.  Vasoactive medications are allowed.  Hospital Course:  The patietn was transferred to the ICU at [facility] to the care of PCCM. The patient required pressor support over the course of the last 24 hours. He was transferred to the floor to the care of [facility] on 10/24/2018.  I have discussed this patient with his oncologist Lavera Guise, MD. He tells me that he feels that the patient is doing poorly with his treatment plan. He has just completed 2 cycles of gemcitabine and Abraxane which is palliative treatment following his completion of his radiation therapy. The most recent CT abdomen 10/12/2018 demonstrates dilatation of the biliary duct. It also demonstrated increase in size of the pancreatic mass. There are also new hepatic lesions that are concerning for metastasis. The patient has no prior history of  hepatic mets as per Dr. Bobby Rumpf.   The patient underwent ERCP with Dr. Henrene Pastor on 10/26/2018. The study revealed compression of the distal stomach and duodenum. The previously placed biliary stent was found to be occluded. It was removed and another non-covered self-expanding metal stent was placed. Bile was flowing freely out of the duct at the end of the procedure.  The patient is continuing to accumulate fluid in his abdomen. The patient underwent paracentesis on 10/31/2018 with removal of 5.5 L of fluid.   Today's assessment: S: The patient is resting comfortably  sitting up at bedside. O: Vitals:  Vitals:   11/01/18 0429 11/01/18 0930  BP: 114/79 112/82  Pulse: (!) 111 (!) 120  Resp: 20   Temp: 98.5 F (36.9 C)   SpO2: 100%     Constitutional:   The patient appears frail and chronically ill. He is awake, alert, and oriented x 3. No acute distress. Respiratory:   No  increased work of breathing.  No wheezes, rales, or rhonchi.  No tactile fremitus. Cardiovascular:   Regular rate and rhythm. Tachycardic.  No murmurs, ectopy, or gallups are appreciated.  No lateral PMI. No thrills. Abdomen:   Abdomen is non-tender, non-distended  No hernias, masses, or organomegaly.  Hypoactive bowel sounds. Musculoskeletal:   No cyanosis, clubbing.  +1 pitting edema of lower extremities bilaterally. Skin:   No rashes, lesions, ulcers  palpation of skin: no induration or nodules Neurologic:   CN 2-12 intact  Sensation all 4 extremities intact Psychiatric:   Mental status ? Mood, affect appropriate ? Orientation to person, place, time   judgment and insight appear intact    Discharge Instructions  Discharge Instructions    Activity as tolerated - No restrictions   Complete by: As directed    Call MD for:  difficulty breathing, headache or visual disturbances   Complete by: As directed    Call MD for:  extreme fatigue   Complete by: As directed    Call MD for:  persistant dizziness or light-headedness   Complete by: As directed    Call MD for:  persistant nausea and vomiting   Complete by: As directed    Call MD for:  severe uncontrolled pain   Complete by: As directed    Diet - low sodium heart healthy   Complete by: As directed    Discharge instructions   Complete by: As directed    Activity as tolerated. Follow up with Dr. Bobby Rumpf as directed. Call for an appointment. Chemistry, liver function test before visit. Report results to Dr. Bobby Rumpf.   Increase activity slowly   Complete by: As directed      Allergies as of 11/01/2018      Reactions   Wintergreen [methyl Salicylate] Rash      Medication List    STOP taking these medications   furosemide 20 MG tablet Commonly known as: LASIX   metFORMIN 500 MG tablet Commonly known as: GLUCOPHAGE     TAKE these medications   Creon 36000 UNITS Cpep capsule Generic drug:  lipase/protease/amylase Take [id number] Units by mouth See admin instructions. Take 2 capsules (72,000 units) three times daily with meals & take 1 capsule (36,000 units) by mouth twice daily with snacks.   feeding supplement (ENSURE ENLIVE) Liqd Take 237 mLs by mouth 3 (three) times daily between meals.   methadone 10 MG tablet Commonly known as: DOLOPHINE Take 10 mg by mouth 2 (two) times daily.   multivitamin with minerals Tabs tablet Take 1 tablet by mouth daily. Start taking on: November 02, 2018   naloxegol oxalate 25 MG Tabs tablet Commonly known as: MOVANTIK Take 1 tablet (25 mg total) by mouth daily. Start taking on: November 02, 2018   OLANZapine 10 MG tablet Commonly known as: ZYPREXA Take 5-10 mg by mouth at bedtime. Take 1 tablet (10 mg) by mouth scheduled each night at bedtime; may take 0.5 tablet (5 mg) by mouth in the morning if experiencing severe nausea.   omeprazole 20 MG capsule Commonly known as: PRILOSEC Take 20 mg by  mouth 2 (two) times daily before a meal.   oxyCODONE 15 MG immediate release tablet Commonly known as: ROXICODONE Take 15 mg by mouth 4 (four) times daily as needed for severe pain.   potassium chloride SA 20 MEQ tablet Commonly known as: K-DUR Take 20 mEq by mouth daily.            Durable Medical Equipment  (From admission, onward)         Start     Ordered   11/01/18 1018  For home use only DME Walker rolling  [facility])  Once    Comments: 5" wheels  Question:  Patient needs a walker to treat with the following condition  Answer:  Debility   11/01/18 1019         Allergies  Allergen Reactions   Wintergreen [Methyl Salicylate] Rash    The results of significant diagnostics from this hospitalization (including imaging, microbiology, ancillary and laboratory) are listed below for reference.    Significant Diagnostic Studies: Dg Chest Port 1 View  Result Date: 10/25/2018 CLINICAL DATA:  Abnormal respirations EXAM:  PORTABLE CHEST 1 VIEW COMPARISON:  10/24/2018 FINDINGS: Left Port-A-Cath remains in place, unchanged. Low lung volumes with bibasilar opacities, left greater than right. Heart is borderline in size. No effusions or pneumothorax. No acute bony abnormality. IMPRESSION: Low lung volumes with bibasilar atelectasis or infiltrates, left greater than right. Electronically Signed   By: Rolm Baptise M.D.   On: 10/25/2018 10:50   Dg Chest Port 1 View  Result Date: 10/24/2018 CLINICAL DATA:  Shortness of breath, history pancreatic cancer EXAM: PORTABLE CHEST 1 VIEW COMPARISON:  Portable exam 0946 hours compared to 10/22/2018 FINDINGS: LEFT subclavian Port-A-Cath with tip projecting over SVC. Normal heart size, mediastinal contours, and pulmonary vascularity. Low lung volumes with increased bibasilar opacities question atelectasis versus infiltrate. Minimal central peribronchial thickening. Skin folds project over LEFT chest. No pleural effusion or pneumothorax. Old healed fracture mid LEFT clavicle. CBD stent RIGHT upper quadrant. IMPRESSION: Decreased lung volumes with increased bibasilar opacities question atelectasis versus infiltrate. Electronically Signed   By: Lavonia Dana M.D.   On: 10/24/2018 09:57   Dg Ercp  Result Date: 10/26/2018 CLINICAL DATA:  68 year old male with a history of biliary stent EXAM: ERCP TECHNIQUE: Multiple spot images obtained with the fluoroscopic device and submitted for interpretation post-procedure. FLUOROSCOPY TIME:  Fluoroscopy Time:  12 minutes 11 seconds COMPARISON:  May 10, 2018 FINDINGS: Limited intraoperative fluoroscopic spot images during ERCP. Initial image demonstrates endoscope projecting over the upper abdomen, as well as metallic biliary stent in position. Subsequently there is cannulation of the ampulla and partial opacification of the intrahepatic and extrahepatic biliary ducts. Deployment of retrieval balloon and placement of additional metallic stent IMPRESSION:  Limited images of ERCP demonstrates partial opacification of the intrahepatic and extrahepatic biliary ducts, deployment of a retrieval balloon, and extension of the biliary stent system. Please refer to the dictated operative report for full details of intraoperative findings and procedure. Electronically Signed   By: Corrie Mckusick D.O.   On: 10/26/2018 11:32   Ir Paracentesis  Result Date: 10/30/2018 INDICATION: Ascites secondary to pancreatic cancer. Request for diagnostic and therapeutic paracentesis. EXAM: ULTRASOUND GUIDED PARACENTESIS MEDICATIONS: 1% lidocaine 10 mL COMPLICATIONS: None immediate. PROCEDURE: Informed written consent was obtained from the patient after a discussion of the risks, benefits and alternatives to treatment. A timeout was performed prior to the initiation of the procedure. Initial ultrasound scanning demonstrates a large amount of ascites within the  left lower abdominal quadrant. The left lower abdomen was prepped and draped in the usual sterile fashion. 1% lidocaine with epinephrine was used for local anesthesia. Following this, a 19 gauge, 7-cm, Yueh catheter was introduced. An ultrasound image was saved for documentation purposes. The paracentesis was performed. The catheter was removed and a dressing was applied. The patient tolerated the procedure well without immediate post procedural complication. FINDINGS: A total of approximately 5.5 L of clear yellow fluid was removed. Samples were sent to the laboratory as requested by the clinical team. IMPRESSION: Successful ultrasound-guided paracentesis yielding 5.5 liters of peritoneal fluid. Read by: Gareth Eagle, PA-C Electronically Signed   By: Lucrezia Europe M.D.   On: 10/30/2018 12:42    Microbiology: Recent Results (from the past 240 hour(s))  MRSA PCR Screening     Status: None   Collection Time: 10/23/18  9:41 AM   Specimen: Nasal Mucosa; Nasopharyngeal  Result Value Ref Range Status   MRSA by PCR NEGATIVE NEGATIVE  Final    Comment:        The GeneXpert MRSA Assay (FDA approved for NASAL specimens only), is one component of a comprehensive MRSA colonization surveillance program. It is not intended to diagnose MRSA infection nor to guide or monitor treatment for MRSA infections. Performed at [facility], [place] [address], [place], [place] [address]   Culture, blood (routine x 2)     Status: None   Collection Time: 10/23/18 10:28 AM   Specimen: BLOOD  Result Value Ref Range Status   Specimen Description BLOOD RIGHT ANTECUBITAL  Final   Special Requests   Final    BOTTLES DRAWN AEROBIC ONLY Blood Culture adequate volume   Culture   Final    NO GROWTH 5 DAYS Performed at [facility], [place] [address], [place], [place] [address]    Report Status 10/28/2018 FINAL  Final  Culture, blood (routine x 2)     Status: Abnormal   Collection Time: 10/23/18 10:40 AM   Specimen: BLOOD RIGHT ARM  Result Value Ref Range Status   Specimen Description BLOOD RIGHT ARM  Final   Special Requests   Final    BOTTLES DRAWN AEROBIC ONLY Blood Culture results may not be optimal due to an inadequate volume of blood received in culture bottles   Culture  Setup Time   Final    GRAM POSITIVE COCCI AEROBIC BOTTLE ONLY CRITICAL RESULT CALLED TO, READ BACK BY AND VERIFIED WITH: PHARMD MIKE [place] [address] [id number] FCP     Culture (A)  Final    STAPHYLOCOCCUS SPECIES (COAGULASE NEGATIVE) THE SIGNIFICANCE OF ISOLATING THIS ORGANISM FROM A SINGLE SET OF BLOOD CULTURES WHEN MULTIPLE SETS ARE DRAWN IS UNCERTAIN. PLEASE NOTIFY THE MICROBIOLOGY DEPARTMENT WITHIN ONE WEEK IF SPECIATION AND SENSITIVITIES ARE REQUIRED.    Report Status 10/29/2018 FINAL  Final  Blood Culture ID Panel (Reflexed)     Status: None   Collection Time: 10/23/18 10:40 AM  Result Value Ref Range Status   Enterococcus species NOT DETECTED NOT DETECTED Final   Listeria monocytogenes NOT DETECTED NOT DETECTED Final   Staphylococcus species NOT  DETECTED NOT DETECTED Final   Staphylococcus aureus (BCID) NOT DETECTED NOT DETECTED Final   Streptococcus species NOT DETECTED NOT DETECTED Final   Streptococcus agalactiae NOT DETECTED NOT DETECTED Final   Streptococcus pneumoniae NOT DETECTED NOT DETECTED Final   Streptococcus pyogenes NOT DETECTED NOT DETECTED Final   Acinetobacter baumannii NOT DETECTED NOT DETECTED Final   Enterobacteriaceae  species NOT DETECTED NOT DETECTED Final   Enterobacter cloacae complex NOT DETECTED NOT DETECTED Final   Escherichia coli NOT DETECTED NOT DETECTED Final   Klebsiella oxytoca NOT DETECTED NOT DETECTED Final   Klebsiella pneumoniae NOT DETECTED NOT DETECTED Final   Proteus species NOT DETECTED NOT DETECTED Final   Serratia marcescens NOT DETECTED NOT DETECTED Final   Haemophilus influenzae NOT DETECTED NOT DETECTED Final   Neisseria meningitidis NOT DETECTED NOT DETECTED Final   Pseudomonas aeruginosa NOT DETECTED NOT DETECTED Final   Candida albicans NOT DETECTED NOT DETECTED Final   Candida glabrata NOT DETECTED NOT DETECTED Final   Candida krusei NOT DETECTED NOT DETECTED Final   Candida parapsilosis NOT DETECTED NOT DETECTED Final   Candida tropicalis NOT DETECTED NOT DETECTED Final    Comment: Performed at [facility], [place] [address] [place], [place] [address]  Urine culture     Status: None   Collection Time: 10/23/18 10:49 AM   Specimen: Urine, Random  Result Value Ref Range Status   Specimen Description URINE, RANDOM  Final   Special Requests NONE  Final   Culture   Final    NO GROWTH Performed at [facility], [place] [address], [place], [place] [address]    Report Status 10/24/2018 FINAL  Final  SARS CORONAVIRUS 2     Status: None   Collection Time: 10/25/18 11:41 PM  Result Value Ref Range Status   SARS Coronavirus 2 NEGATIVE NEGATIVE Final    Comment: (NOTE) SARS-CoV-2 target nucleic acids are NOT DETECTED. The SARS-CoV-2 RNA is generally detectable in upper and  lower respiratory specimens during the acute phase of infection. Negative results do not preclude SARS-CoV-2 infection, do not rule out co-infections with other pathogens, and should not be used as the sole basis for treatment or other patient management decisions. Negative results must be combined with clinical observations, patient history, and epidemiological information. The expected result is Negative. Fact Sheet for Patients: SugarRoll.be Fact Sheet for Healthcare Providers: https://www.woods-mathews.com/ This test is not yet approved or cleared by the [place] FDA and  has been authorized for detection and/or diagnosis of SARS-CoV-2 by FDA under an Emergency Use Authorization (EUA). This EUA will remain  in effect (meaning this test can be used) for the duration of the COVID-19 declaration under Section 56 4(b)(1) of the Act, 21 U.S.C. section 360bbb-3(b)(1), unless the authorization is terminated or revoked sooner. Performed at [facility], [place] [address] [place], [place] [address]   Culture, blood (routine x 2)     Status: None (Preliminary result)   Collection Time: 10/30/18  9:39 AM   Specimen: BLOOD  Result Value Ref Range Status   Specimen Description BLOOD LEFT ANTECUBITAL  Final   Special Requests   Final    BOTTLES DRAWN AEROBIC ONLY Blood Culture adequate volume   Culture   Final    NO GROWTH 2 DAYS Performed at [facility], [place] [address], [place], [place] [address]    Report Status PENDING  Incomplete  Culture, blood (routine x 2)     Status: None (Preliminary result)   Collection Time: 10/30/18  9:39 AM   Specimen: BLOOD RIGHT ARM  Result Value Ref Range Status   Specimen Description BLOOD RIGHT ARM  Final   Special Requests   Final    BOTTLES DRAWN AEROBIC ONLY Blood Culture adequate volume   Culture   Final    NO GROWTH 2 DAYS Performed  at [facility], [place] [address] [place],  [place] [address]    Report Status PENDING  Incomplete  Culture, body fluid-bottle     Status: None (Preliminary result)   Collection Time: 10/30/18 11:46 AM   Specimen: Peritoneal Washings  Result Value Ref Range Status   Specimen Description PERITONEAL  Final   Special Requests FLUID  Final   Culture   Final    NO GROWTH 2 DAYS Performed at [facility], [address], [place], [place] [address]    Report Status PENDING  Incomplete  Gram stain     Status: None   Collection Time: 10/30/18 11:46 AM   Specimen: Peritoneal Washings  Result Value Ref Range Status   Specimen Description PERITONEAL  Final   Special Requests FLUID  Final   Gram Stain   Final    CYTOSPIN SMEAR WBC PRESENT,BOTH PMN AND MONONUCLEAR NO ORGANISMS SEEN Performed at [facility], [address] [place], [place] [address]    Report Status 10/30/2018 FINAL  Final     Labs: Basic Metabolic Panel: Recent Labs  Lab 10/26/18 0422 10/27/18 0331 10/28/18 0507 10/29/18 1044 10/31/18 0327  NA 134* 135 136 135 133*  K 3.4* 3.2* 4.4 4.4 3.8  CL 100 100 103 101 101  CO2 28 28 24 25 25   GLUCOSE 116* 137* 148* 173* 157*  BUN 8 10 10 13 11   CREATININE 0.47* 0.46* 0.47* 0.45* 0.50*  CALCIUM 7.7* 7.4* 7.8* 8.0* 7.9*   Liver Function Tests: Recent Labs  Lab 10/27/18 0331 10/28/18 0507 10/29/18 1044  AST 41 57* 51*  ALT 39 39 41  ALKPHOS 425* 477* 524*  BILITOT 6.8* 5.3* 5.6*  PROT 4.8* 5.5* 6.0*  ALBUMIN 1.6* 1.7* 1.8*   No results for input(s): LIPASE, AMYLASE in the last 168 hours. No results for input(s): AMMONIA in the last 168 hours. CBC: Recent Labs  Lab 10/26/18 0422 10/27/18 0331 10/29/18 1044  WBC 5.1 4.0 7.8  NEUTROABS 3.5 2.4  --   HGB 8.4* 7.6* 7.9*  HCT 23.9* 21.7* 23.8*  MCV 91.9 91.6 93.7  PLT 91* 106* 252   Cardiac Enzymes: No results for input(s): CKTOTAL, CKMB, CKMBINDEX, TROPONINI in the last 168 hours. BNP: BNP (last 3 results) No results for input(s): BNP in  the last 8760 hours.  ProBNP (last 3 results) No results for input(s): PROBNP in the last 8760 hours.  CBG: No results for input(s): GLUCAP in the last 168 hours.  Active Problems:   Bile duct obstruction   Sepsis ([place])   Encounter for replacement of biliary stent   Gall stones, common bile duct   Time coordinating discharge: 38 minutes.  Signed:        Shyane Fossum, DO [facility]  11/01/2018, 2:48 PM

## 2018-11-01 NOTE — Evaluation (Signed)
Occupational Therapy Evaluation and Discharge Patient Details Name: Tommy Oconnor MRN: [id number] DOB: [birth year] Today's Date: 11/01/2018    History of Present Illness Patient is a 68 year old male diagnosed with pancreatic cancer about a year ago. Reported nausea/vomiting diahrrea. s/p ERCP to clear out bile duct and stent placement. Patient also s/p paracentesis. PMH to include HTN.   Clinical Impression   Pt is functioning at a supervision level in ADL and ADL transfers. Only required assistance for opening packages on meal tray due to minimal functioning in R UE. Encouraged continued use of resting hand splint at night.  No acute OT needs. Recommend continued ADL with nursing staff. Wife is home with pt 24 hours.     Follow Up Recommendations  No OT follow up    Equipment Recommendations  None recommended by OT    Recommendations for Other Services       Precautions / Restrictions Precautions Precautions: Fall Restrictions Weight Bearing Restrictions: No      Mobility Bed Mobility Overal bed mobility: Modified Independent             General bed mobility comments: HOB up, no difficulty  Transfers Overall transfer level: Needs assistance Equipment used: None Transfers: Sit to/from Stand Sit to Stand: Supervision         General transfer comment: supervision for safety, no physical assist or unsteadiness noted    Balance     Sitting balance-Leahy Scale: Normal       Standing balance-Leahy Scale: Good                             ADL either performed or assessed with clinical judgement   ADL Overall ADL's : Needs assistance/impaired Eating/Feeding: Minimal assistance;Bed level Eating/Feeding Details (indicate cue type and reason): to open packages Grooming: Wash/dry hands;Wash/dry face;Sitting;Set up   Upper Body Bathing: Supervision/ safety;Standing   Lower Body Bathing: Supervison/ safety;Sit to/from stand   Upper Body Dressing : Set  up;Sitting   Lower Body Dressing: Supervision/safety;Sit to/from stand   Toilet Transfer: Supervision/safety;Ambulation   Toileting- Clothing Manipulation and Hygiene: Supervision/safety;Sit to/from stand       Functional mobility during ADLs: Supervision/safety       Vision Baseline Vision/History: Wears glasses Wears Glasses: Reading only Patient Visual Report: No change from baseline       Perception     Praxis      Pertinent Vitals/Pain Pain Assessment: Faces     Hand Dominance Left(by default)   Extremity/Trunk Assessment Upper Extremity Assessment Upper Extremity Assessment: RUE deficits/detail RUE Deficits / Details: functional stabilizer, wears a resting hand splint at night RUE Coordination: decreased fine motor;decreased gross motor   Lower Extremity Assessment Lower Extremity Assessment: Defer to PT evaluation   Cervical / Trunk Assessment Cervical / Trunk Assessment: Normal   Communication Communication Communication: No difficulties   Cognition Arousal/Alertness: Awake/alert Behavior During Therapy: WFL for tasks assessed/performed Overall Cognitive Status: Within Functional Limits for tasks assessed                                 General Comments: pt reports he manages his own meds and dr appointments, does not drive   General Comments       Exercises     Shoulder Instructions      Home Living Family/patient expects to be discharged to:: Private residence Living Arrangements: Spouse/significant other  Available Help at Discharge: Family;Available 24 hours/day Type of Home: House Home Access: Level entry     Home Layout: One level     Bathroom Shower/Tub: Teacher, early years/pre: Standard     Home Equipment: None          Prior Functioning/Environment Level of Independence: Independent                 OT Problem List:        OT Treatment/Interventions:      OT Goals(Current goals can be  found in the care plan section) Acute Rehab OT Goals Patient Stated Goal: to return home  OT Frequency:     Barriers to D/C:            Co-evaluation              AM-PAC OT "6 Clicks" Daily Activity     Outcome Measure Help from another person eating meals?: None Help from another person taking care of personal grooming?: A Little Help from another person toileting, which includes using toliet, bedpan, or urinal?: A Little Help from another person bathing (including washing, rinsing, drying)?: A Little Help from another person to put on and taking off regular upper body clothing?: None Help from another person to put on and taking off regular lower body clothing?: A Little 6 Click Score: 20   End of Session    Activity Tolerance: Patient tolerated treatment well Patient left: in chair;with call bell/phone within reach;with nursing/sitter in room  OT Visit Diagnosis: Unsteadiness on feet (R26.81)                [id number] OT Time Calculation (min): 22 min Charges:  OT General Charges $OT Visit: 1 Visit OT Evaluation $OT Eval Low Complexity: 1 Low  Nestor Lewandowsky, OTR/L Acute Rehabilitation Services Pager: [phone] Office: [phone]  Malka So 11/01/2018, 9:36 AM

## 2018-11-01 NOTE — TOC Transition Note (Signed)
Transition of Care [facility]) - CM/SW Discharge Note   Patient Details  Name: Tommy Oconnor MRN: [id number] Date of Birth: 1950-10-03  Transition of Care [facility]) CM/SW Contact:  Alberteen Sam, LCSW Phone Number: 11/01/2018, 12:57 PM   Clinical Narrative:     Patient is set up with [facility] services for PT/OT/RN and aide. Patient reports he does not need a walker ordered as he has one at home identical to the one used in the hospital. Patient reports no needs as his family will be picking him up from the hospital today.    Final next level of care: Home w Home Health Services Barriers to Discharge: No Barriers Identified   Patient Goals and CMS Choice   CMS Medicare.gov Compare Post Acute Care list provided to:: Patient Choice offered to / list presented to : Patient  Discharge Placement                  Name of family member notified: N/A - patient will inform    Discharge Plan and Services                          HH Arranged: Nurse's Aide, RN, PT, OT [place] Agency: [facility] (formerly [employer]) Date [place]: 11/01/18 Time HH Agency Contacted: 1000 Representative spoke with at [place]: [place] ([place]) Interventions     Readmission Risk Interventions No flowsheet data found.

## 2018-11-04 LAB — CULTURE, BLOOD (ROUTINE X 2)
Culture: NO GROWTH
Culture: NO GROWTH
Special Requests: ADEQUATE
Special Requests: ADEQUATE

## 2018-11-04 LAB — CULTURE, BODY FLUID W GRAM STAIN -BOTTLE: Culture: NO GROWTH

## 2018-11-08 DIAGNOSIS — C251 Malignant neoplasm of body of pancreas: Secondary | ICD-10-CM | POA: Diagnosis not present

## 2018-11-09 DIAGNOSIS — R180 Malignant ascites: Secondary | ICD-10-CM | POA: Diagnosis not present

## 2018-11-09 DIAGNOSIS — C259 Malignant neoplasm of pancreas, unspecified: Secondary | ICD-10-CM | POA: Diagnosis not present

## 2018-11-09 DIAGNOSIS — C251 Malignant neoplasm of body of pancreas: Secondary | ICD-10-CM | POA: Diagnosis not present

## 2018-11-12 DIAGNOSIS — R0602 Shortness of breath: Secondary | ICD-10-CM | POA: Diagnosis not present

## 2018-11-12 DIAGNOSIS — Z03818 Encounter for observation for suspected exposure to other biological agents ruled out: Secondary | ICD-10-CM | POA: Diagnosis not present

## 2018-11-12 DIAGNOSIS — R188 Other ascites: Secondary | ICD-10-CM | POA: Diagnosis not present

## 2018-11-13 DIAGNOSIS — R0600 Dyspnea, unspecified: Secondary | ICD-10-CM | POA: Diagnosis not present

## 2018-11-13 DIAGNOSIS — R180 Malignant ascites: Secondary | ICD-10-CM | POA: Diagnosis not present

## 2018-11-13 DIAGNOSIS — Z66 Do not resuscitate: Secondary | ICD-10-CM | POA: Diagnosis present

## 2018-11-13 DIAGNOSIS — D630 Anemia in neoplastic disease: Secondary | ICD-10-CM | POA: Diagnosis not present

## 2018-11-13 DIAGNOSIS — E871 Hypo-osmolality and hyponatremia: Secondary | ICD-10-CM | POA: Diagnosis present

## 2018-11-13 DIAGNOSIS — I10 Essential (primary) hypertension: Secondary | ICD-10-CM | POA: Diagnosis present

## 2018-11-13 DIAGNOSIS — R188 Other ascites: Secondary | ICD-10-CM | POA: Diagnosis present

## 2018-11-13 DIAGNOSIS — M19072 Primary osteoarthritis, left ankle and foot: Secondary | ICD-10-CM | POA: Diagnosis present

## 2018-11-13 DIAGNOSIS — M19071 Primary osteoarthritis, right ankle and foot: Secondary | ICD-10-CM | POA: Diagnosis present

## 2018-11-13 DIAGNOSIS — R0602 Shortness of breath: Secondary | ICD-10-CM | POA: Diagnosis not present

## 2018-11-13 DIAGNOSIS — Z03818 Encounter for observation for suspected exposure to other biological agents ruled out: Secondary | ICD-10-CM | POA: Diagnosis not present

## 2018-11-13 DIAGNOSIS — Z79899 Other long term (current) drug therapy: Secondary | ICD-10-CM | POA: Diagnosis not present

## 2018-11-13 DIAGNOSIS — C799 Secondary malignant neoplasm of unspecified site: Secondary | ICD-10-CM | POA: Diagnosis not present

## 2018-11-13 DIAGNOSIS — C259 Malignant neoplasm of pancreas, unspecified: Secondary | ICD-10-CM | POA: Diagnosis present

## 2018-11-16 DIAGNOSIS — R0600 Dyspnea, unspecified: Secondary | ICD-10-CM | POA: Diagnosis not present

## 2018-11-16 DIAGNOSIS — C259 Malignant neoplasm of pancreas, unspecified: Secondary | ICD-10-CM | POA: Diagnosis not present

## 2018-11-16 DIAGNOSIS — E871 Hypo-osmolality and hyponatremia: Secondary | ICD-10-CM | POA: Diagnosis not present

## 2018-11-17 DIAGNOSIS — I2510 Atherosclerotic heart disease of native coronary artery without angina pectoris: Secondary | ICD-10-CM | POA: Diagnosis not present

## 2018-11-17 DIAGNOSIS — Z438 Encounter for attention to other artificial openings: Secondary | ICD-10-CM | POA: Diagnosis not present

## 2018-11-17 DIAGNOSIS — C259 Malignant neoplasm of pancreas, unspecified: Secondary | ICD-10-CM | POA: Diagnosis not present

## 2018-11-17 DIAGNOSIS — D649 Anemia, unspecified: Secondary | ICD-10-CM | POA: Diagnosis not present

## 2018-11-17 DIAGNOSIS — R188 Other ascites: Secondary | ICD-10-CM | POA: Diagnosis not present

## 2018-11-17 DIAGNOSIS — I10 Essential (primary) hypertension: Secondary | ICD-10-CM | POA: Diagnosis not present

## 2018-11-19 DIAGNOSIS — R188 Other ascites: Secondary | ICD-10-CM | POA: Diagnosis not present

## 2018-11-19 DIAGNOSIS — D649 Anemia, unspecified: Secondary | ICD-10-CM | POA: Diagnosis not present

## 2018-11-19 DIAGNOSIS — I2510 Atherosclerotic heart disease of native coronary artery without angina pectoris: Secondary | ICD-10-CM | POA: Diagnosis not present

## 2018-11-19 DIAGNOSIS — I10 Essential (primary) hypertension: Secondary | ICD-10-CM | POA: Diagnosis not present

## 2018-11-19 DIAGNOSIS — Z438 Encounter for attention to other artificial openings: Secondary | ICD-10-CM | POA: Diagnosis not present

## 2018-11-19 DIAGNOSIS — C259 Malignant neoplasm of pancreas, unspecified: Secondary | ICD-10-CM | POA: Diagnosis not present

## 2018-11-20 DIAGNOSIS — R188 Other ascites: Secondary | ICD-10-CM | POA: Diagnosis not present

## 2018-11-20 DIAGNOSIS — Z438 Encounter for attention to other artificial openings: Secondary | ICD-10-CM | POA: Diagnosis not present

## 2018-11-20 DIAGNOSIS — I10 Essential (primary) hypertension: Secondary | ICD-10-CM | POA: Diagnosis not present

## 2018-11-20 DIAGNOSIS — I2510 Atherosclerotic heart disease of native coronary artery without angina pectoris: Secondary | ICD-10-CM | POA: Diagnosis not present

## 2018-11-20 DIAGNOSIS — C259 Malignant neoplasm of pancreas, unspecified: Secondary | ICD-10-CM | POA: Diagnosis not present

## 2018-11-20 DIAGNOSIS — D649 Anemia, unspecified: Secondary | ICD-10-CM | POA: Diagnosis not present

## 2018-11-21 DIAGNOSIS — R188 Other ascites: Secondary | ICD-10-CM | POA: Diagnosis not present

## 2018-11-21 DIAGNOSIS — Z438 Encounter for attention to other artificial openings: Secondary | ICD-10-CM | POA: Diagnosis not present

## 2018-11-21 DIAGNOSIS — D649 Anemia, unspecified: Secondary | ICD-10-CM | POA: Diagnosis not present

## 2018-11-21 DIAGNOSIS — C259 Malignant neoplasm of pancreas, unspecified: Secondary | ICD-10-CM | POA: Diagnosis not present

## 2018-11-21 DIAGNOSIS — I10 Essential (primary) hypertension: Secondary | ICD-10-CM | POA: Diagnosis not present

## 2018-11-21 DIAGNOSIS — I2510 Atherosclerotic heart disease of native coronary artery without angina pectoris: Secondary | ICD-10-CM | POA: Diagnosis not present

## 2018-11-22 DIAGNOSIS — R188 Other ascites: Secondary | ICD-10-CM | POA: Diagnosis not present

## 2018-11-22 DIAGNOSIS — R279 Unspecified lack of coordination: Secondary | ICD-10-CM | POA: Diagnosis not present

## 2018-11-22 DIAGNOSIS — I10 Essential (primary) hypertension: Secondary | ICD-10-CM | POA: Diagnosis not present

## 2018-11-22 DIAGNOSIS — Z743 Need for continuous supervision: Secondary | ICD-10-CM | POA: Diagnosis not present

## 2018-11-22 DIAGNOSIS — D649 Anemia, unspecified: Secondary | ICD-10-CM | POA: Diagnosis not present

## 2018-11-22 DIAGNOSIS — C259 Malignant neoplasm of pancreas, unspecified: Secondary | ICD-10-CM | POA: Diagnosis not present

## 2018-11-22 DIAGNOSIS — R5381 Other malaise: Secondary | ICD-10-CM | POA: Diagnosis not present

## 2018-11-22 DIAGNOSIS — R52 Pain, unspecified: Secondary | ICD-10-CM | POA: Diagnosis not present

## 2018-11-22 DIAGNOSIS — I2510 Atherosclerotic heart disease of native coronary artery without angina pectoris: Secondary | ICD-10-CM | POA: Diagnosis not present

## 2018-11-22 DIAGNOSIS — Z438 Encounter for attention to other artificial openings: Secondary | ICD-10-CM | POA: Diagnosis not present

## 2018-11-23 DIAGNOSIS — Z438 Encounter for attention to other artificial openings: Secondary | ICD-10-CM | POA: Diagnosis not present

## 2018-11-23 DIAGNOSIS — R188 Other ascites: Secondary | ICD-10-CM | POA: Diagnosis not present

## 2018-11-23 DIAGNOSIS — I2510 Atherosclerotic heart disease of native coronary artery without angina pectoris: Secondary | ICD-10-CM | POA: Diagnosis not present

## 2018-11-23 DIAGNOSIS — I10 Essential (primary) hypertension: Secondary | ICD-10-CM | POA: Diagnosis not present

## 2018-11-23 DIAGNOSIS — C259 Malignant neoplasm of pancreas, unspecified: Secondary | ICD-10-CM | POA: Diagnosis not present

## 2018-11-23 DIAGNOSIS — D649 Anemia, unspecified: Secondary | ICD-10-CM | POA: Diagnosis not present

## 2018-11-24 DIAGNOSIS — C259 Malignant neoplasm of pancreas, unspecified: Secondary | ICD-10-CM | POA: Diagnosis not present

## 2018-11-24 DIAGNOSIS — I2510 Atherosclerotic heart disease of native coronary artery without angina pectoris: Secondary | ICD-10-CM | POA: Diagnosis not present

## 2018-11-24 DIAGNOSIS — D649 Anemia, unspecified: Secondary | ICD-10-CM | POA: Diagnosis not present

## 2018-11-24 DIAGNOSIS — Z438 Encounter for attention to other artificial openings: Secondary | ICD-10-CM | POA: Diagnosis not present

## 2018-11-24 DIAGNOSIS — I10 Essential (primary) hypertension: Secondary | ICD-10-CM | POA: Diagnosis not present

## 2018-11-24 DIAGNOSIS — R188 Other ascites: Secondary | ICD-10-CM | POA: Diagnosis not present

## 2018-11-25 DIAGNOSIS — I2510 Atherosclerotic heart disease of native coronary artery without angina pectoris: Secondary | ICD-10-CM | POA: Diagnosis not present

## 2018-11-25 DIAGNOSIS — R188 Other ascites: Secondary | ICD-10-CM | POA: Diagnosis not present

## 2018-11-25 DIAGNOSIS — C259 Malignant neoplasm of pancreas, unspecified: Secondary | ICD-10-CM | POA: Diagnosis not present

## 2018-11-25 DIAGNOSIS — D649 Anemia, unspecified: Secondary | ICD-10-CM | POA: Diagnosis not present

## 2018-11-25 DIAGNOSIS — Z438 Encounter for attention to other artificial openings: Secondary | ICD-10-CM | POA: Diagnosis not present

## 2018-11-25 DIAGNOSIS — I10 Essential (primary) hypertension: Secondary | ICD-10-CM | POA: Diagnosis not present

## 2018-11-26 DIAGNOSIS — D649 Anemia, unspecified: Secondary | ICD-10-CM | POA: Diagnosis not present

## 2018-11-26 DIAGNOSIS — C259 Malignant neoplasm of pancreas, unspecified: Secondary | ICD-10-CM | POA: Diagnosis not present

## 2018-11-26 DIAGNOSIS — Z438 Encounter for attention to other artificial openings: Secondary | ICD-10-CM | POA: Diagnosis not present

## 2018-11-26 DIAGNOSIS — I2510 Atherosclerotic heart disease of native coronary artery without angina pectoris: Secondary | ICD-10-CM | POA: Diagnosis not present

## 2018-11-26 DIAGNOSIS — R188 Other ascites: Secondary | ICD-10-CM | POA: Diagnosis not present

## 2018-11-26 DIAGNOSIS — I10 Essential (primary) hypertension: Secondary | ICD-10-CM | POA: Diagnosis not present

## 2018-11-27 DIAGNOSIS — D649 Anemia, unspecified: Secondary | ICD-10-CM | POA: Diagnosis not present

## 2018-11-27 DIAGNOSIS — I10 Essential (primary) hypertension: Secondary | ICD-10-CM | POA: Diagnosis not present

## 2018-11-27 DIAGNOSIS — C259 Malignant neoplasm of pancreas, unspecified: Secondary | ICD-10-CM | POA: Diagnosis not present

## 2018-11-27 DIAGNOSIS — R188 Other ascites: Secondary | ICD-10-CM | POA: Diagnosis not present

## 2018-11-27 DIAGNOSIS — Z438 Encounter for attention to other artificial openings: Secondary | ICD-10-CM | POA: Diagnosis not present

## 2018-11-27 DIAGNOSIS — I2510 Atherosclerotic heart disease of native coronary artery without angina pectoris: Secondary | ICD-10-CM | POA: Diagnosis not present

## 2018-11-28 DIAGNOSIS — R0902 Hypoxemia: Secondary | ICD-10-CM | POA: Diagnosis not present

## 2018-11-28 DIAGNOSIS — I10 Essential (primary) hypertension: Secondary | ICD-10-CM | POA: Diagnosis not present

## 2018-11-28 DIAGNOSIS — Z438 Encounter for attention to other artificial openings: Secondary | ICD-10-CM | POA: Diagnosis not present

## 2018-11-28 DIAGNOSIS — R188 Other ascites: Secondary | ICD-10-CM | POA: Diagnosis not present

## 2018-11-28 DIAGNOSIS — C259 Malignant neoplasm of pancreas, unspecified: Secondary | ICD-10-CM | POA: Diagnosis not present

## 2018-11-28 DIAGNOSIS — I2510 Atherosclerotic heart disease of native coronary artery without angina pectoris: Secondary | ICD-10-CM | POA: Diagnosis not present

## 2018-11-28 DIAGNOSIS — D649 Anemia, unspecified: Secondary | ICD-10-CM | POA: Diagnosis not present

## 2018-11-28 DIAGNOSIS — Z7401 Bed confinement status: Secondary | ICD-10-CM | POA: Diagnosis not present

## 2018-11-30 DIAGNOSIS — C259 Malignant neoplasm of pancreas, unspecified: Secondary | ICD-10-CM | POA: Diagnosis not present

## 2018-11-30 DIAGNOSIS — D649 Anemia, unspecified: Secondary | ICD-10-CM | POA: Diagnosis not present

## 2018-11-30 DIAGNOSIS — Z438 Encounter for attention to other artificial openings: Secondary | ICD-10-CM | POA: Diagnosis not present

## 2018-11-30 DIAGNOSIS — I2510 Atherosclerotic heart disease of native coronary artery without angina pectoris: Secondary | ICD-10-CM | POA: Diagnosis not present

## 2018-11-30 DIAGNOSIS — R188 Other ascites: Secondary | ICD-10-CM | POA: Diagnosis not present

## 2018-11-30 DIAGNOSIS — I10 Essential (primary) hypertension: Secondary | ICD-10-CM | POA: Diagnosis not present

## 2018-12-01 DIAGNOSIS — Z438 Encounter for attention to other artificial openings: Secondary | ICD-10-CM | POA: Diagnosis not present

## 2018-12-01 DIAGNOSIS — D649 Anemia, unspecified: Secondary | ICD-10-CM | POA: Diagnosis not present

## 2018-12-01 DIAGNOSIS — R188 Other ascites: Secondary | ICD-10-CM | POA: Diagnosis not present

## 2018-12-01 DIAGNOSIS — I10 Essential (primary) hypertension: Secondary | ICD-10-CM | POA: Diagnosis not present

## 2018-12-01 DIAGNOSIS — I2510 Atherosclerotic heart disease of native coronary artery without angina pectoris: Secondary | ICD-10-CM | POA: Diagnosis not present

## 2018-12-01 DIAGNOSIS — C259 Malignant neoplasm of pancreas, unspecified: Secondary | ICD-10-CM | POA: Diagnosis not present

## 2018-12-03 DIAGNOSIS — I2510 Atherosclerotic heart disease of native coronary artery without angina pectoris: Secondary | ICD-10-CM | POA: Diagnosis not present

## 2018-12-03 DIAGNOSIS — I10 Essential (primary) hypertension: Secondary | ICD-10-CM | POA: Diagnosis not present

## 2018-12-03 DIAGNOSIS — Z438 Encounter for attention to other artificial openings: Secondary | ICD-10-CM | POA: Diagnosis not present

## 2018-12-03 DIAGNOSIS — R188 Other ascites: Secondary | ICD-10-CM | POA: Diagnosis not present

## 2018-12-03 DIAGNOSIS — C259 Malignant neoplasm of pancreas, unspecified: Secondary | ICD-10-CM | POA: Diagnosis not present

## 2018-12-03 DIAGNOSIS — D649 Anemia, unspecified: Secondary | ICD-10-CM | POA: Diagnosis not present

## 2018-12-04 DIAGNOSIS — R188 Other ascites: Secondary | ICD-10-CM | POA: Diagnosis not present

## 2018-12-04 DIAGNOSIS — I10 Essential (primary) hypertension: Secondary | ICD-10-CM | POA: Diagnosis not present

## 2018-12-04 DIAGNOSIS — Z438 Encounter for attention to other artificial openings: Secondary | ICD-10-CM | POA: Diagnosis not present

## 2018-12-04 DIAGNOSIS — I2510 Atherosclerotic heart disease of native coronary artery without angina pectoris: Secondary | ICD-10-CM | POA: Diagnosis not present

## 2018-12-04 DIAGNOSIS — D649 Anemia, unspecified: Secondary | ICD-10-CM | POA: Diagnosis not present

## 2018-12-04 DIAGNOSIS — C259 Malignant neoplasm of pancreas, unspecified: Secondary | ICD-10-CM | POA: Diagnosis not present

## 2018-12-05 DIAGNOSIS — I2510 Atherosclerotic heart disease of native coronary artery without angina pectoris: Secondary | ICD-10-CM | POA: Diagnosis not present

## 2018-12-05 DIAGNOSIS — I10 Essential (primary) hypertension: Secondary | ICD-10-CM | POA: Diagnosis not present

## 2018-12-05 DIAGNOSIS — C259 Malignant neoplasm of pancreas, unspecified: Secondary | ICD-10-CM | POA: Diagnosis not present

## 2018-12-05 DIAGNOSIS — R188 Other ascites: Secondary | ICD-10-CM | POA: Diagnosis not present

## 2018-12-05 DIAGNOSIS — D649 Anemia, unspecified: Secondary | ICD-10-CM | POA: Diagnosis not present

## 2018-12-05 DIAGNOSIS — Z438 Encounter for attention to other artificial openings: Secondary | ICD-10-CM | POA: Diagnosis not present

## 2018-12-06 DIAGNOSIS — I2510 Atherosclerotic heart disease of native coronary artery without angina pectoris: Secondary | ICD-10-CM | POA: Diagnosis not present

## 2018-12-06 DIAGNOSIS — I10 Essential (primary) hypertension: Secondary | ICD-10-CM | POA: Diagnosis not present

## 2018-12-06 DIAGNOSIS — Z438 Encounter for attention to other artificial openings: Secondary | ICD-10-CM | POA: Diagnosis not present

## 2018-12-06 DIAGNOSIS — D649 Anemia, unspecified: Secondary | ICD-10-CM | POA: Diagnosis not present

## 2018-12-06 DIAGNOSIS — C259 Malignant neoplasm of pancreas, unspecified: Secondary | ICD-10-CM | POA: Diagnosis not present

## 2018-12-06 DIAGNOSIS — R188 Other ascites: Secondary | ICD-10-CM | POA: Diagnosis not present

## 2019-01-06 DEATH — deceased

## 2020-08-09 IMAGING — RF DG ERCP WO/W SPHINCTEROTOMY
1 series · 11 of 11 positions shown · non-contrast
Comparison: None.

CLINICAL DATA: Pancreatic carcinoma

EXAM:
ERCP
TECHNIQUE: Multiple spot images obtained with the fluoroscopic device and
submitted for interpretation post-procedure.

[Series 1: run · 11 of 11 slices shown]
[im 1/11]
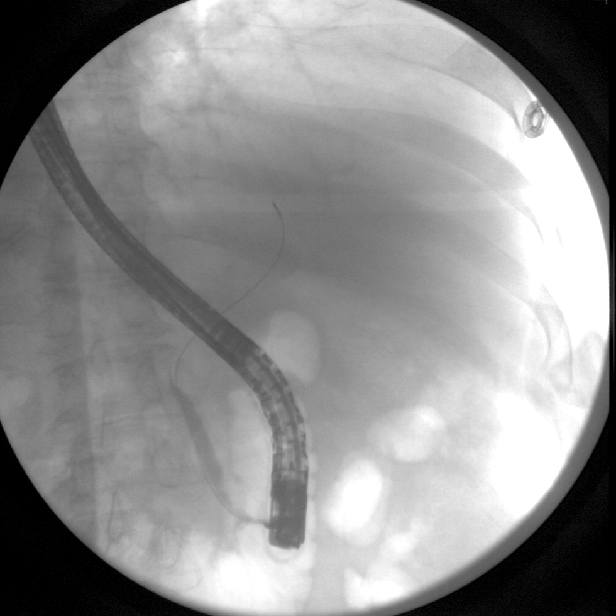
[im 2/11]
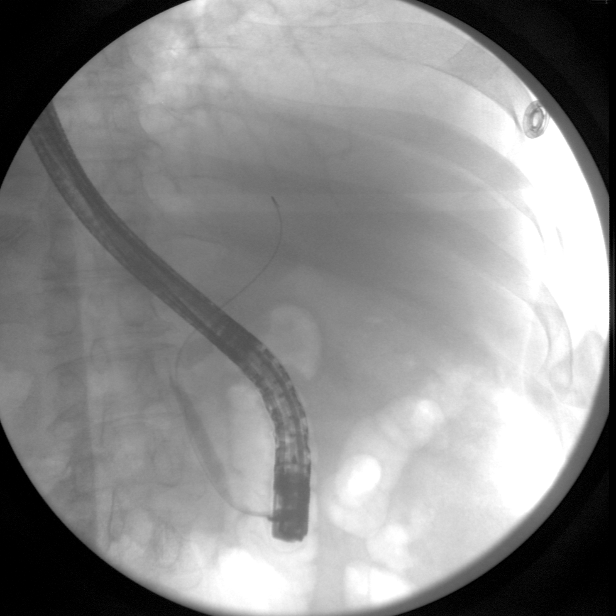
[im 3/11]
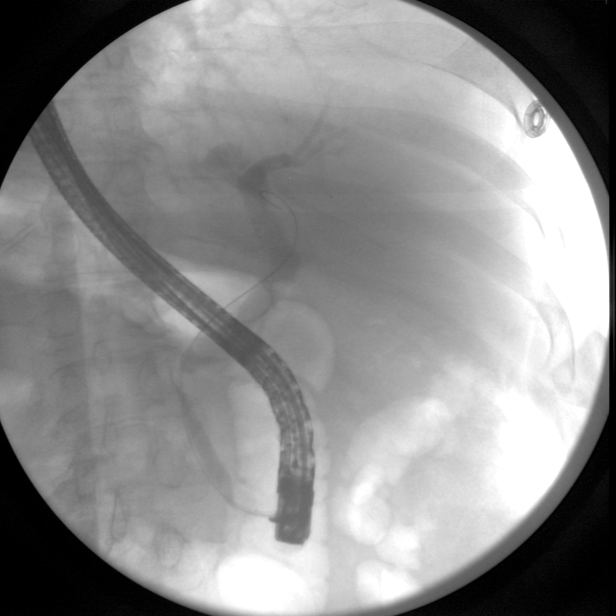
[im 4/11]
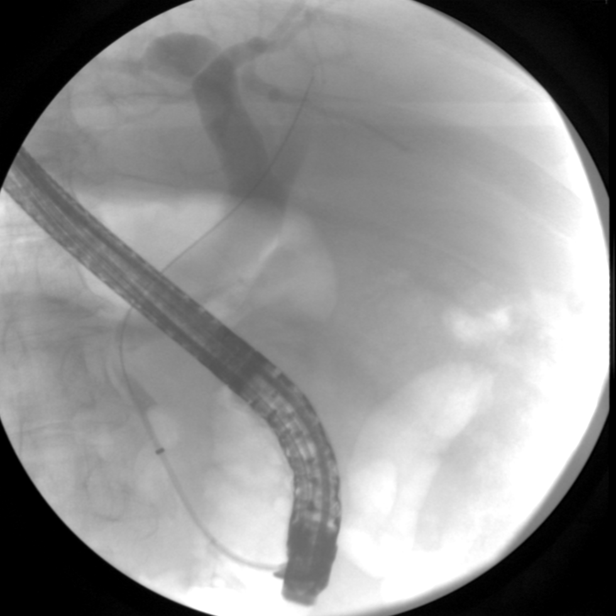
[im 5/11]
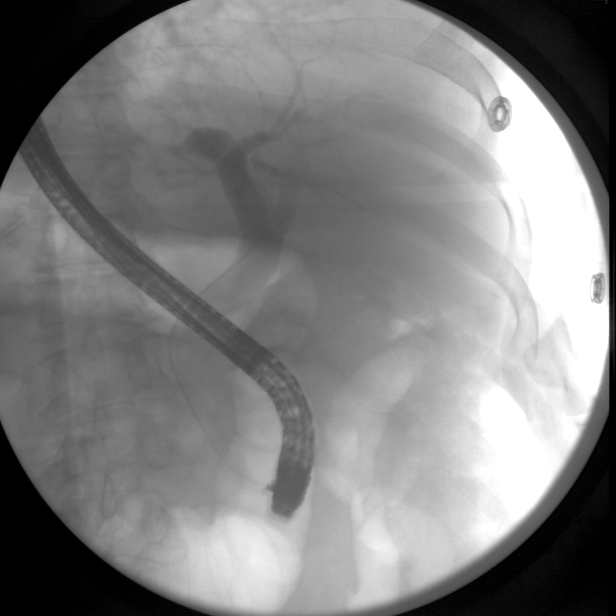
[im 6/11]
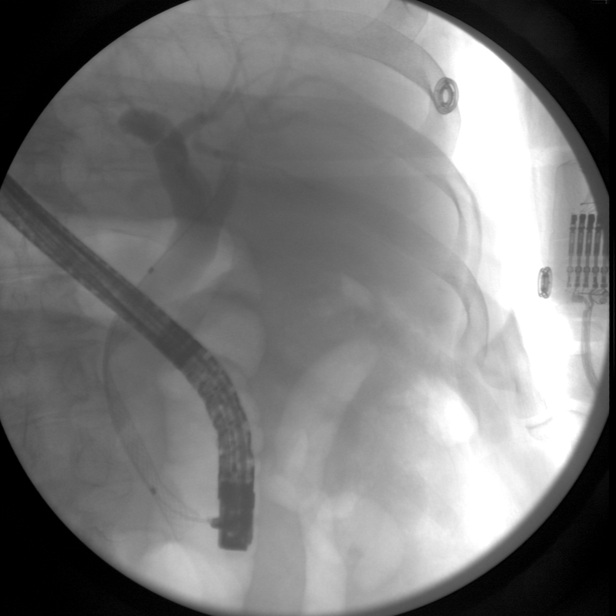
[im 7/11]
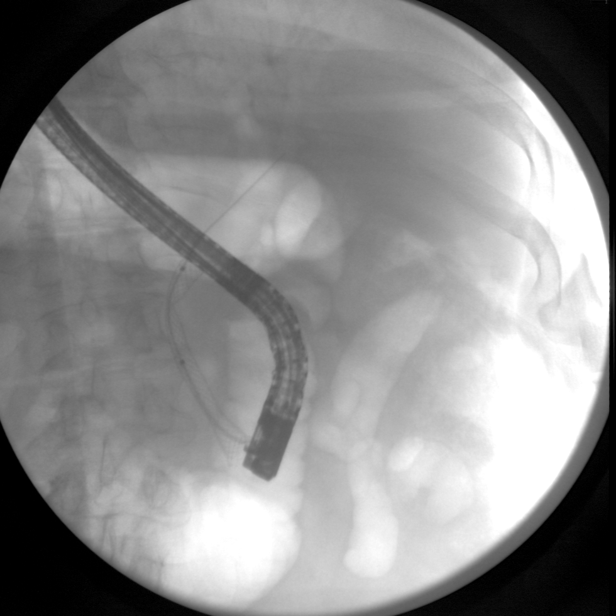
[im 8/11]
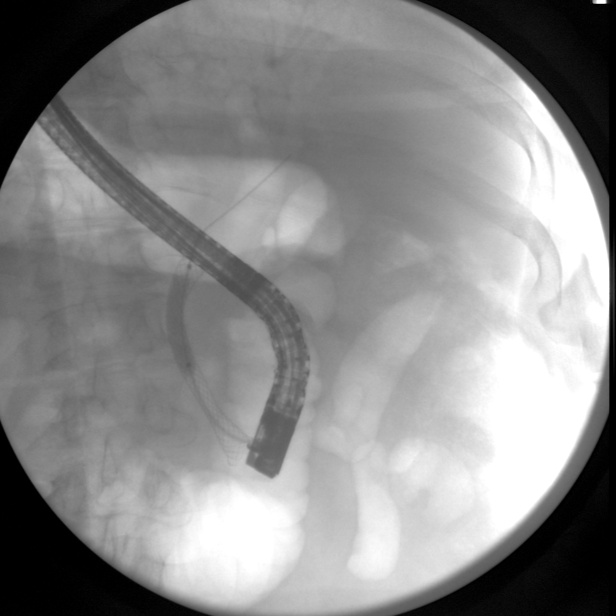
[im 9/11]
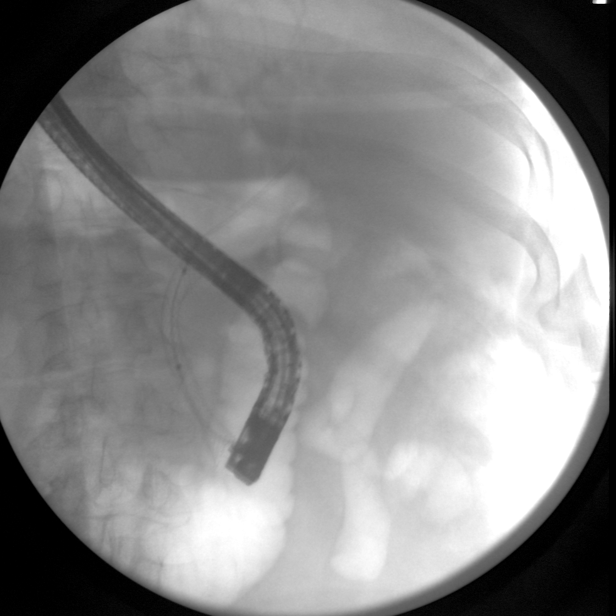
[im 10/11]
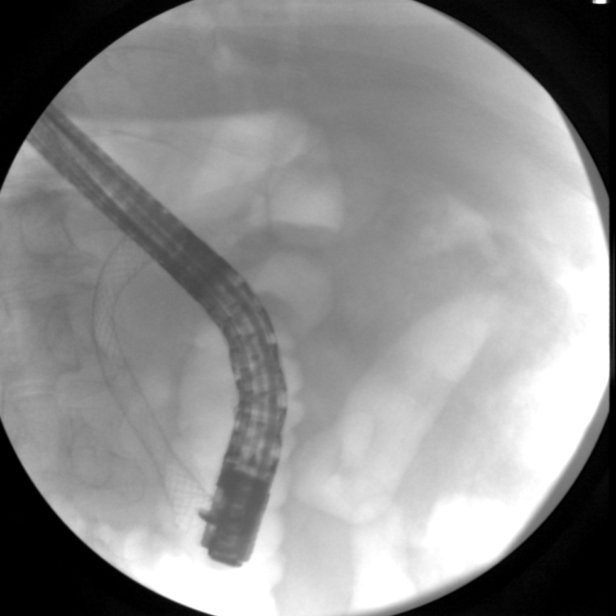
[im 11/11]
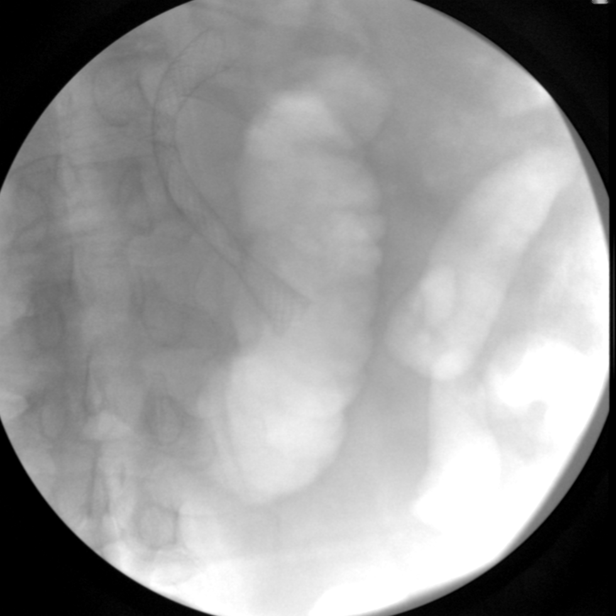

[11 of 11 positions shown; findings below may reference images not displayed]

FINDINGS: A series of fluoroscopic spot images document endoscopic cannulation
and opacification of the CBD. There is a high-grade stenosis in the
mid CBD. There is ectasia of the proximal CBD and central biliary
confluence. Subsequent images document placement of metallic biliary
stent through the mid and distal CBD across the lesion with balloon
dilatation at the level of stenosis. There is incomplete
opacification of the intrahepatic biliary tree, which appears
decompressed peripherally. No evidence of extravasation
IMPRESSION: 1. Endoscopic metallic stent placement across high-grade CBD
stenosis.

These images were submitted for radiologic interpretation only.
Please see the procedural report for the amount of contrast and the
fluoroscopy time utilized.

## 2021-08-18 IMAGING — DX PORTABLE CHEST - 1 VIEW
1 series · 1 of 1 positions shown · non-contrast
Comparison: 10/24/2018

CLINICAL DATA: Abnormal respirations

EXAM:
PORTABLE CHEST 1 VIEW

[chest ap]
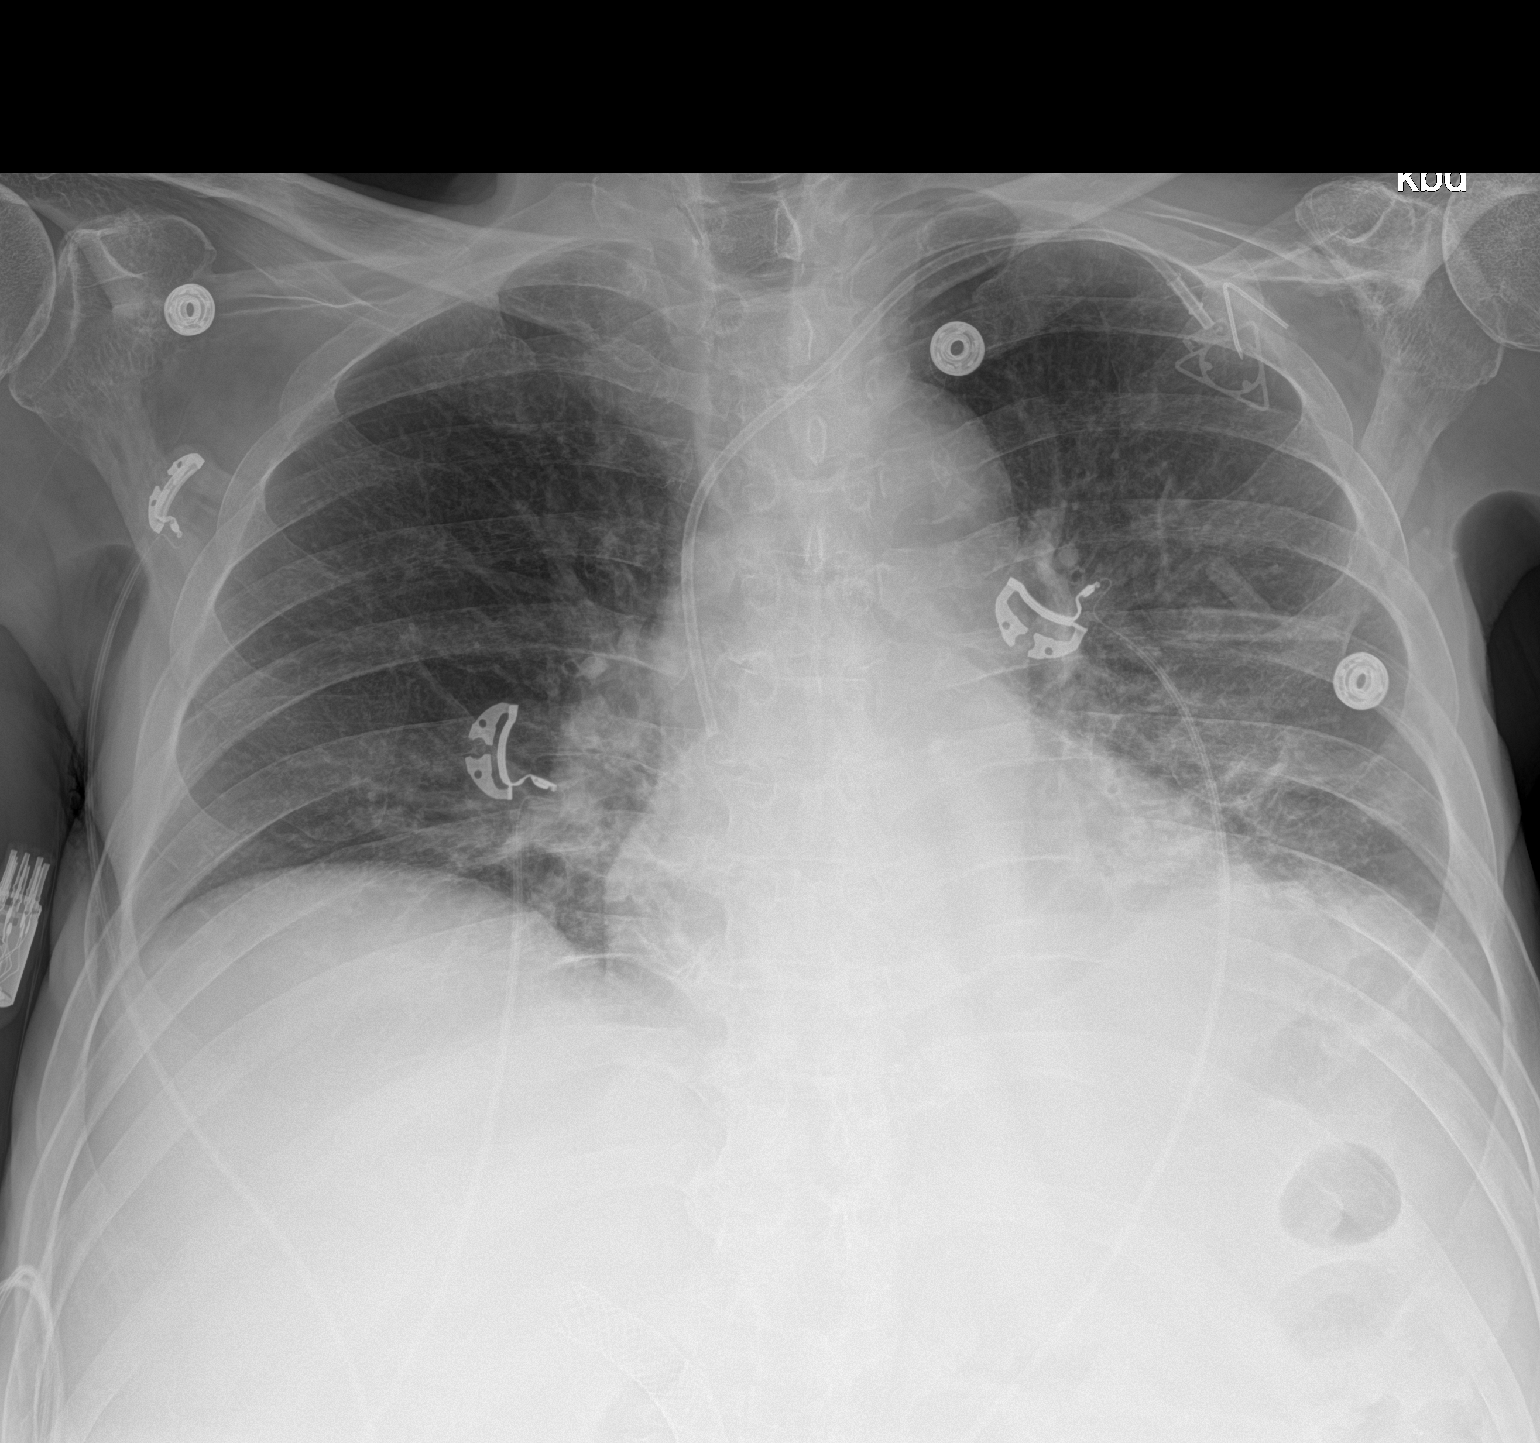

[1 of 1 positions shown; findings below may reference images not displayed]

FINDINGS: Left Port-A-Cath remains in place, unchanged. Low lung volumes with
bibasilar opacities, left greater than right. Heart is borderline in
size. No effusions or pneumothorax. No acute bony abnormality.
IMPRESSION: Low lung volumes with bibasilar atelectasis or infiltrates, left
greater than right.
